# Patient Record
Sex: Female | Born: 1955 | Race: White | Hispanic: No | Marital: Married | Smoking: Never smoker
Health system: Southern US, Community
[De-identification: ages and names within clinical notes are randomized; demographics above are authoritative.]

## PROBLEM LIST (undated history)

## (undated) DIAGNOSIS — R42 Dizziness and giddiness: Secondary | ICD-10-CM

## (undated) DIAGNOSIS — C801 Malignant (primary) neoplasm, unspecified: Secondary | ICD-10-CM

## (undated) DIAGNOSIS — E7800 Pure hypercholesterolemia, unspecified: Secondary | ICD-10-CM

## (undated) DIAGNOSIS — Z9989 Dependence on other enabling machines and devices: Secondary | ICD-10-CM

## (undated) DIAGNOSIS — G4733 Obstructive sleep apnea (adult) (pediatric): Secondary | ICD-10-CM

## (undated) DIAGNOSIS — H8120 Vestibular neuronitis, unspecified ear: Secondary | ICD-10-CM

## (undated) DIAGNOSIS — I10 Essential (primary) hypertension: Secondary | ICD-10-CM

## (undated) DIAGNOSIS — M1990 Unspecified osteoarthritis, unspecified site: Secondary | ICD-10-CM

## (undated) DIAGNOSIS — K219 Gastro-esophageal reflux disease without esophagitis: Secondary | ICD-10-CM

## (undated) DIAGNOSIS — E039 Hypothyroidism, unspecified: Secondary | ICD-10-CM

## (undated) DIAGNOSIS — I499 Cardiac arrhythmia, unspecified: Secondary | ICD-10-CM

## (undated) DIAGNOSIS — Z8619 Personal history of other infectious and parasitic diseases: Secondary | ICD-10-CM

## (undated) DIAGNOSIS — E669 Obesity, unspecified: Secondary | ICD-10-CM

## (undated) HISTORY — DX: Vestibular neuronitis, unspecified ear: H81.20

## (undated) HISTORY — DX: Obstructive sleep apnea (adult) (pediatric): G47.33

## (undated) HISTORY — DX: Dizziness and giddiness: R42

## (undated) HISTORY — DX: Obesity, unspecified: E66.9

## (undated) HISTORY — DX: Dependence on other enabling machines and devices: Z99.89

---
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
## 1983-05-10 HISTORY — PX: TUBAL LIGATION: SHX77

## 1999-09-29 ENCOUNTER — Encounter: Payer: Self-pay | Admitting: Family Medicine

## 1999-09-29 ENCOUNTER — Encounter: Admission: RE | Admit: 1999-09-29 | Discharge: 1999-09-29 | Payer: Self-pay | Admitting: Family Medicine

## 2000-01-06 ENCOUNTER — Ambulatory Visit (HOSPITAL_COMMUNITY): Admission: RE | Admit: 2000-01-06 | Discharge: 2000-01-06 | Payer: Self-pay | Admitting: *Deleted

## 2000-01-06 ENCOUNTER — Encounter: Payer: Self-pay | Admitting: *Deleted

## 2000-10-26 ENCOUNTER — Encounter: Payer: Self-pay | Admitting: Family Medicine

## 2000-10-26 ENCOUNTER — Encounter: Admission: RE | Admit: 2000-10-26 | Discharge: 2000-10-26 | Payer: Self-pay | Admitting: Family Medicine

## 2001-01-05 ENCOUNTER — Other Ambulatory Visit: Admission: RE | Admit: 2001-01-05 | Discharge: 2001-01-05 | Payer: Self-pay | Admitting: Family Medicine

## 2001-01-15 ENCOUNTER — Encounter: Payer: Self-pay | Admitting: Family Medicine

## 2001-01-15 ENCOUNTER — Ambulatory Visit (HOSPITAL_COMMUNITY): Admission: RE | Admit: 2001-01-15 | Discharge: 2001-01-15 | Payer: Self-pay | Admitting: Family Medicine

## 2001-10-29 ENCOUNTER — Encounter: Admission: RE | Admit: 2001-10-29 | Discharge: 2001-10-29 | Payer: Self-pay | Admitting: Family Medicine

## 2001-10-29 ENCOUNTER — Encounter: Payer: Self-pay | Admitting: Family Medicine

## 2002-10-31 ENCOUNTER — Encounter: Payer: Self-pay | Admitting: Family Medicine

## 2002-10-31 ENCOUNTER — Encounter: Admission: RE | Admit: 2002-10-31 | Discharge: 2002-10-31 | Payer: Self-pay | Admitting: Family Medicine

## 2003-10-29 ENCOUNTER — Other Ambulatory Visit: Admission: RE | Admit: 2003-10-29 | Discharge: 2003-10-29 | Payer: Self-pay | Admitting: Family Medicine

## 2003-10-30 ENCOUNTER — Encounter: Admission: RE | Admit: 2003-10-30 | Discharge: 2003-10-30 | Payer: Self-pay | Admitting: Family Medicine

## 2004-11-01 ENCOUNTER — Other Ambulatory Visit: Admission: RE | Admit: 2004-11-01 | Discharge: 2004-11-01 | Payer: Self-pay | Admitting: Family Medicine

## 2004-11-08 ENCOUNTER — Encounter: Admission: RE | Admit: 2004-11-08 | Discharge: 2004-11-08 | Payer: Self-pay | Admitting: Family Medicine

## 2005-11-02 ENCOUNTER — Encounter: Admission: RE | Admit: 2005-11-02 | Discharge: 2005-11-02 | Payer: Self-pay | Admitting: Family Medicine

## 2005-11-04 ENCOUNTER — Other Ambulatory Visit: Admission: RE | Admit: 2005-11-04 | Discharge: 2005-11-04 | Payer: Self-pay | Admitting: Family Medicine

## 2006-11-01 ENCOUNTER — Encounter: Admission: RE | Admit: 2006-11-01 | Discharge: 2006-11-01 | Payer: Self-pay | Admitting: Family Medicine

## 2006-11-29 ENCOUNTER — Ambulatory Visit: Payer: Self-pay

## 2006-12-04 ENCOUNTER — Encounter: Admission: RE | Admit: 2006-12-04 | Discharge: 2006-12-04 | Payer: Self-pay | Admitting: Family Medicine

## 2006-12-20 ENCOUNTER — Ambulatory Visit: Payer: Self-pay | Admitting: Otolaryngology

## 2007-11-01 ENCOUNTER — Encounter: Admission: RE | Admit: 2007-11-01 | Discharge: 2007-11-01 | Payer: Self-pay | Admitting: Family Medicine

## 2007-11-19 IMAGING — CR DG FEMUR 2V*L*
4 series · 4 of 4 positions shown · non-contrast
Comparison: None.

CLINICAL DATA: Anterior and mid femur pain.
 LEFT FEMUR, TWO VIEWS:

[t femur with hip  ap left]
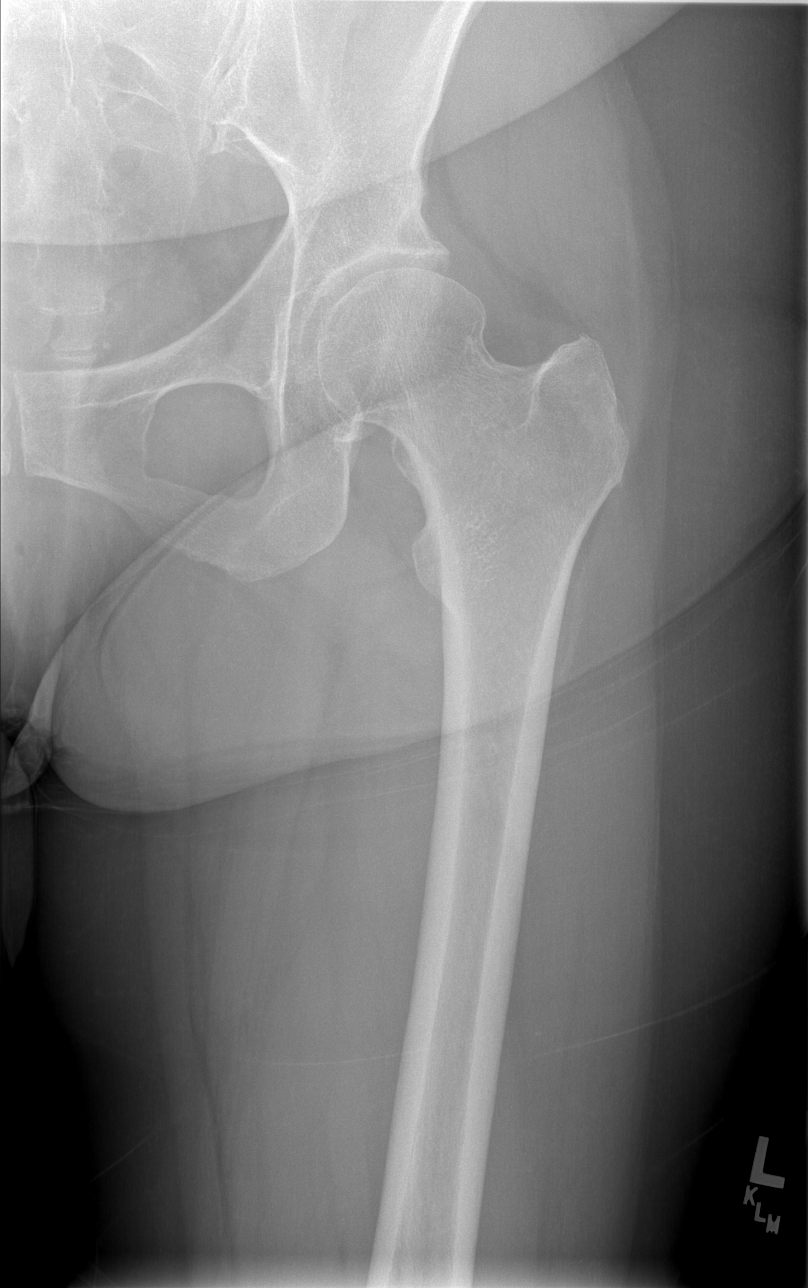

[t femur with knee ap left]
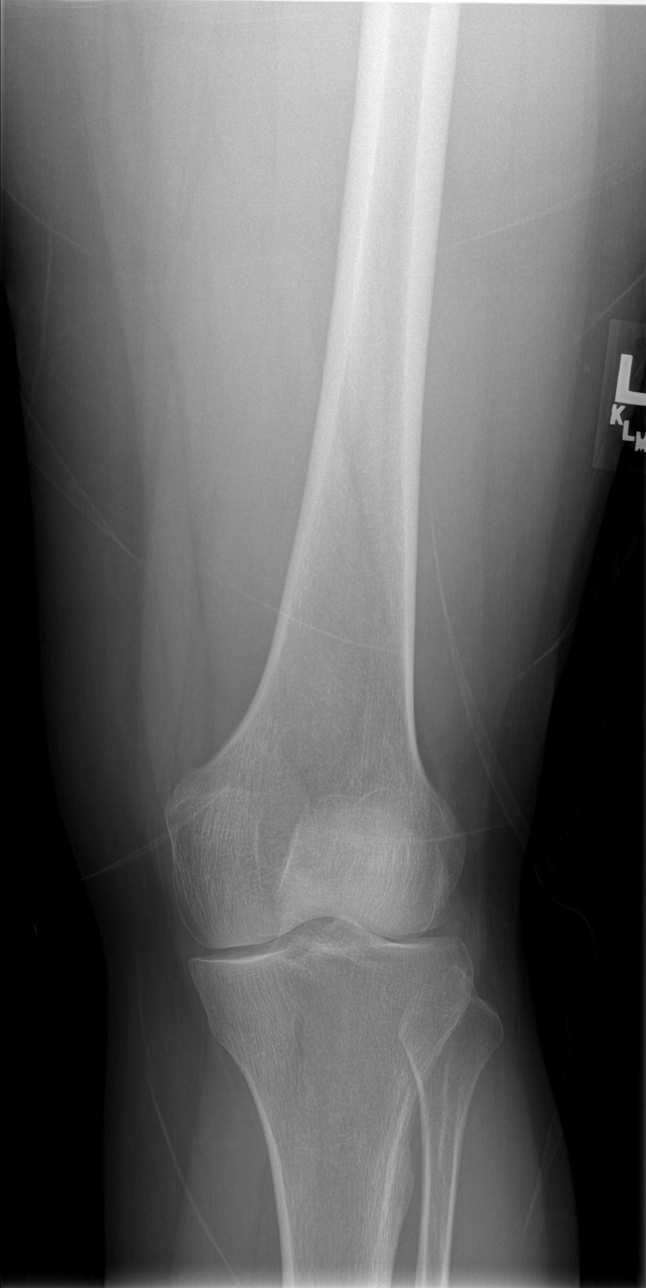

[t femur with hip lat left]
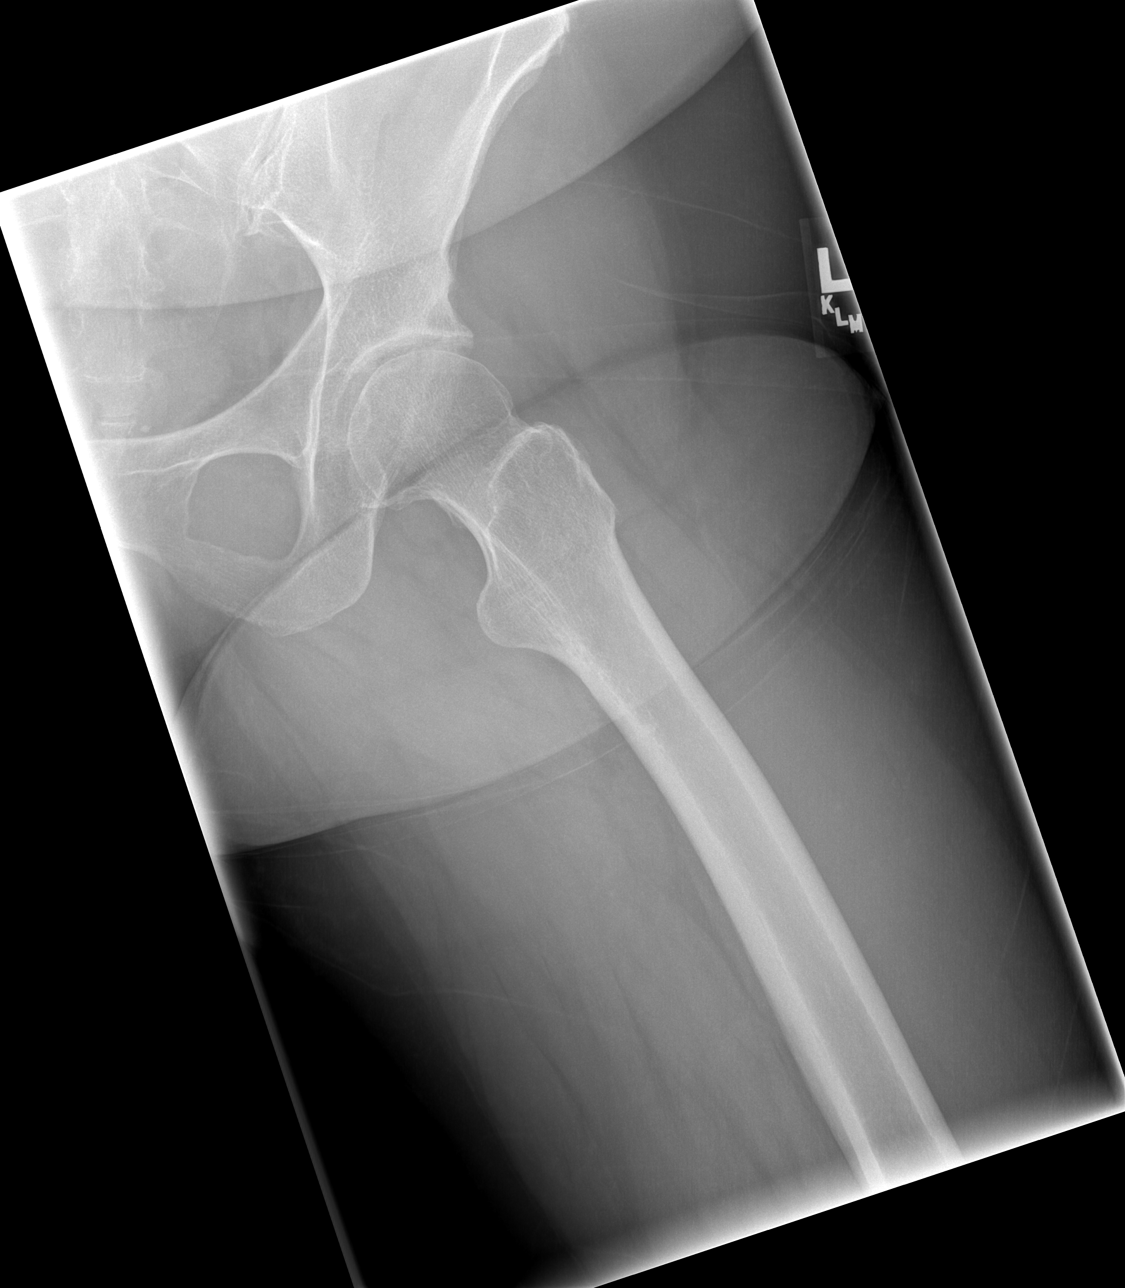

[t femur with knee lat left]
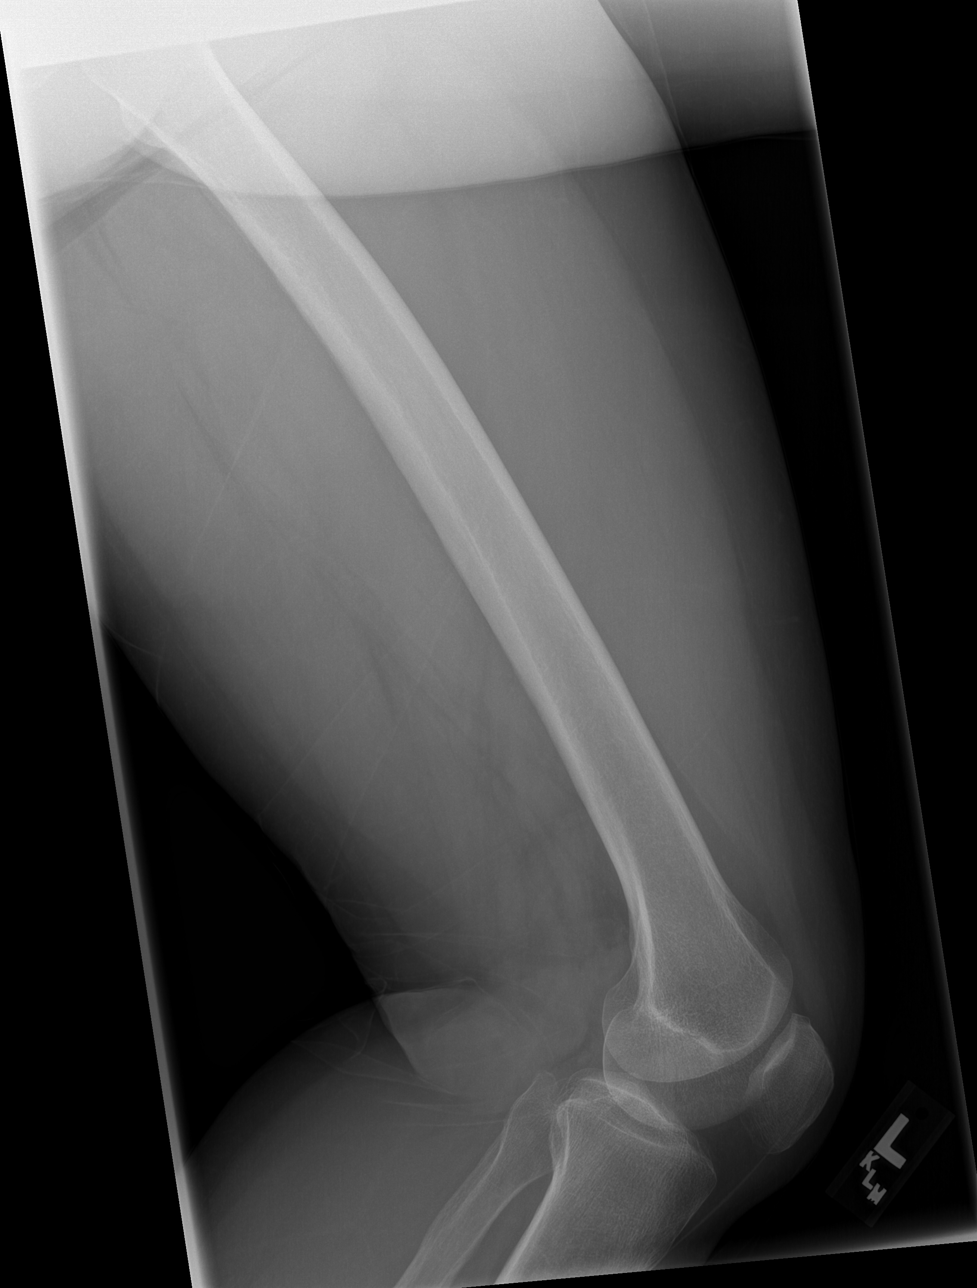

[4 of 4 positions shown; findings below may reference images not displayed]

FINDINGS: No acute osseous abnormality.
IMPRESSION: No acute osseous abnormality.

## 2008-08-27 ENCOUNTER — Encounter: Admission: RE | Admit: 2008-08-27 | Discharge: 2008-08-27 | Payer: Self-pay | Admitting: Family Medicine

## 2008-08-27 ENCOUNTER — Other Ambulatory Visit: Admission: RE | Admit: 2008-08-27 | Discharge: 2008-08-27 | Payer: Self-pay | Admitting: Family Medicine

## 2008-10-16 ENCOUNTER — Encounter: Admission: RE | Admit: 2008-10-16 | Discharge: 2008-10-16 | Payer: Self-pay | Admitting: Orthopedic Surgery

## 2008-10-30 ENCOUNTER — Encounter: Admission: RE | Admit: 2008-10-30 | Discharge: 2008-10-30 | Payer: Self-pay | Admitting: Family Medicine

## 2009-08-19 ENCOUNTER — Encounter: Admission: RE | Admit: 2009-08-19 | Discharge: 2009-08-19 | Payer: Self-pay | Admitting: Orthopedic Surgery

## 2009-10-29 ENCOUNTER — Encounter: Admission: RE | Admit: 2009-10-29 | Discharge: 2009-10-29 | Payer: Self-pay | Admitting: Family Medicine

## 2010-01-13 LAB — HM COLONOSCOPY

## 2010-02-03 ENCOUNTER — Encounter: Admission: RE | Admit: 2010-02-03 | Discharge: 2010-02-03 | Payer: Self-pay | Admitting: Orthopedic Surgery

## 2010-05-26 ENCOUNTER — Encounter
Admission: RE | Admit: 2010-05-26 | Discharge: 2010-05-26 | Payer: Self-pay | Source: Home / Self Care | Attending: Orthopedic Surgery | Admitting: Orthopedic Surgery

## 2010-09-29 ENCOUNTER — Other Ambulatory Visit: Payer: Self-pay | Admitting: Family Medicine

## 2010-09-29 ENCOUNTER — Other Ambulatory Visit (HOSPITAL_COMMUNITY)
Admission: RE | Admit: 2010-09-29 | Discharge: 2010-09-29 | Disposition: A | Discharge status: Home / Self Care | Payer: BC Managed Care – PPO | Source: Ambulatory Visit | Attending: Family Medicine | Admitting: Family Medicine

## 2010-09-29 DIAGNOSIS — Z124 Encounter for screening for malignant neoplasm of cervix: Secondary | ICD-10-CM | POA: Insufficient documentation

## 2010-10-12 ENCOUNTER — Other Ambulatory Visit: Payer: Self-pay | Admitting: Family Medicine

## 2010-10-12 DIAGNOSIS — Z1231 Encounter for screening mammogram for malignant neoplasm of breast: Secondary | ICD-10-CM

## 2010-10-21 ENCOUNTER — Other Ambulatory Visit: Payer: Self-pay | Admitting: Family Medicine

## 2010-10-21 ENCOUNTER — Ambulatory Visit
Admission: RE | Admit: 2010-10-21 | Discharge: 2010-10-21 | Disposition: A | Discharge status: Home / Self Care | Payer: BC Managed Care – PPO | Source: Ambulatory Visit | Attending: Family Medicine | Admitting: Family Medicine

## 2010-10-21 DIAGNOSIS — Z1231 Encounter for screening mammogram for malignant neoplasm of breast: Secondary | ICD-10-CM

## 2011-06-08 ENCOUNTER — Other Ambulatory Visit: Payer: Self-pay | Admitting: Obstetrics and Gynecology

## 2011-08-18 ENCOUNTER — Encounter (HOSPITAL_COMMUNITY): Payer: Self-pay

## 2011-08-24 ENCOUNTER — Other Ambulatory Visit: Payer: Self-pay | Admitting: Obstetrics and Gynecology

## 2011-08-31 ENCOUNTER — Other Ambulatory Visit: Payer: Self-pay

## 2011-08-31 ENCOUNTER — Encounter (HOSPITAL_COMMUNITY)
Admission: RE | Admit: 2011-08-31 | Discharge: 2011-08-31 | Disposition: A | Discharge status: Home / Self Care | Payer: BC Managed Care – PPO | Source: Ambulatory Visit | Attending: Obstetrics and Gynecology | Admitting: Obstetrics and Gynecology

## 2011-08-31 ENCOUNTER — Encounter (HOSPITAL_COMMUNITY): Payer: Self-pay

## 2011-08-31 DIAGNOSIS — Z01812 Encounter for preprocedural laboratory examination: Secondary | ICD-10-CM | POA: Insufficient documentation

## 2011-08-31 DIAGNOSIS — Z01818 Encounter for other preprocedural examination: Secondary | ICD-10-CM | POA: Insufficient documentation

## 2011-08-31 HISTORY — DX: Essential (primary) hypertension: I10

## 2011-08-31 LAB — DIFFERENTIAL
Basophils Absolute: 0 K/uL (ref 0.0–0.1)
Basophils Relative: 1 % (ref 0–1)
Eosinophils Absolute: 0.2 K/uL (ref 0.0–0.7)
Eosinophils Relative: 2 % (ref 0–5)
Lymphocytes Relative: 27 % (ref 12–46)
Lymphs Abs: 1.8 K/uL (ref 0.7–4.0)
Monocytes Absolute: 0.5 K/uL (ref 0.1–1.0)
Monocytes Relative: 7 % (ref 3–12)
Neutro Abs: 4.2 K/uL (ref 1.7–7.7)
Neutrophils Relative %: 63 % (ref 43–77)

## 2011-08-31 LAB — BASIC METABOLIC PANEL
BUN: 13 mg/dL (ref 6–23)
CO2: 30 mEq/L (ref 19–32)
Creatinine, Ser: 0.75 mg/dL (ref 0.50–1.10)
GFR calc non Af Amer: >90 mL/min (ref >90)
Sodium: 140 mEq/L (ref 135–145)

## 2011-08-31 LAB — CBC
Hemoglobin: 13.5 g/dL (ref 12.0–15.0)
MCH: 30.2 pg (ref 26.0–34.0)
MCHC: 33.8 g/dL (ref 30.0–36.0)
MCV: 89.5 fL (ref 78.0–100.0)
RBC: 4.47 MIL/uL (ref 3.87–5.11)
RDW: 12.9 % (ref 11.5–15.5)

## 2011-08-31 NOTE — Patient Instructions (Signed)
20 Mary Munoz  08/31/2011   Your procedure is scheduled on:  5/15  Enter through the Main Entrance of [facility] at 930 AM.  Pick up the phone at the desk and dial [phone].   Call this number if you have problems the morning of surgery: [phone]   Remember:   Do not eat food:After Midnight.  Do not drink clear liquids: After Midnight.  Take these medicines the morning of surgery with A SIP OF WATER: Blood Pressure medication   Do not wear jewelry, make-up or nail polish.  Do not wear lotions, powders, or perfumes. You may wear deodorant.  Do not shave 48 hours prior to surgery.  Do not bring valuables to the hospital.  Contacts, dentures or bridgework may not be worn into surgery.  Leave suitcase in the car. After surgery it may be brought to your room.  For patients admitted to the hospital, checkout time is 11:00 AM the day of discharge.   Patients discharged the day of surgery will not be allowed to drive home.  Name and phone number of your driver: husband  Lowry Ram  Special Instructions: CHG Shower Use Special Wash: 1/2 bottle night before surgery and 1/2 bottle morning of surgery.   Please read over the following fact sheets that you were given: Surgical Site Infection Prevention

## 2011-08-31 NOTE — Pre-Procedure Instructions (Signed)
Anes pre-op by Dr. Malen Gauze, EKG and medical history reviewed.  Patton Salles, RN

## 2011-09-05 ENCOUNTER — Ambulatory Visit: Admit: 2011-09-05 | Payer: Self-pay | Admitting: Obstetrics and Gynecology

## 2011-09-05 SURGERY — DILATATION & CURETTAGE/HYSTEROSCOPY WITH RESECTOCOPE
Anesthesia: Choice

## 2011-09-14 ENCOUNTER — Other Ambulatory Visit: Payer: Self-pay | Admitting: Obstetrics and Gynecology

## 2011-09-14 NOTE — H&P (Signed)
09/01/2011  Reason for Appointment  1. Pre-op   History of Present Illness  General:  56 y/o G1P1 present for preop for Hysteroscopy/D&C on 09/21/2011. Pt has a h/o postmenopausal bleeding, which may have been due to unopposed estrogen she received during a natural HRT program. On 3/13, an ultrasound showed a thickened endometrium, 1.36 cm and an endometrial mass, suggesitive of a polyp, despite Provera. Surgical intervention recommend. Pt has not had any more bleeding since Provera.   Current Medications  Tramadol 50 mg tablet one tab every 4-6 hours prn pain  Diclofenac Sodium 75 MG Tablet Delayed Release 1 tablet twice a day if needed for pain  Glucosamine 500 MG Tablet 1 tablet once a day  Vitamin B6 250 MG Tablet 1 tablet Once a day  Zinc Acetate (Oral) 25 MG Capsule 1 capsule on an empty stomach once a day  Vitamin D 5000 IU Capsule 1 tablet once a day  Magnesium 500 MG Capsule 1 tablet once a day  Hydrochlorothiazide 25 MG Tablet 1 tablet Once a day  Medication List reviewed and reconciled with the patient   Past Medical History  goiter DX:240.9  Hyperlipidemia  Htn  Obesity  Postmenopausal  left hip problems - Ortho: Dr Anna Voytek  Obstructive sleep apnea (PSG 10/21/10, ESS 14, AHI 38/hr REM 54/hr, RDI 52/hr REM 59/hr, O2 nadir 70%; CPAP 11/13/10 CPAP 8 with AHI 0)   Surgical History  BTL   breast biopsy    Family History  Father: deceased 86 yrs MI - 86, HTN   Mother: deceased 87 yrs dementia, HTN, diabetes, thyroid disease   Sister 1: alive 68 yrs colon cancer/ age 68, thyroid problems   Sister 2: alive 62 yrs thyroid problems   no early CAD , denies any GYN family cancer hx.   Social History  General:  History of smoking  cigarettes: Never smoked no Smoking.  no Alcohol.  Caffeine: yes, 1 serving daily, coffee.  no Recreational drug use.  Diet: low fat.  no Exercise, nothing structured - limited due to her hip.  Occupation: Clerk/Accounting.  Education:  Some College.  Marital Status: married.  Children: 1, son (s) - lives close by.  Seat belt use: yes.    Gyn History  Sexual activity currently sexually active, men only.  Periods : postmenopausal.  LMP started bleeding 06/04/11 and it was like a normal period. Had cramping as well. Nothing Sunday and Monday but spotting yesterday and today..  Birth control BTL.  Last pap smear date 09/29/10.  Last mammogram date planned June 2012.  Abnormal pap smear yes but last 3 have been normal. .  Denies H/O STD .  GYN procedures Pelvic U/S 07/20/11.    OB History  Number of pregnancies 1 - vaginal delivery.  miscarriages 0.  abortion 0.  Pregnancy # 1 live birth, vaginal delivery.    Allergies  Sulfa drugs (for allergy): sores in mouth: Allergy  citric acid: bladder infection: Side Effects   Hospitalization/Major Diagnostic Procedure  childbirth   breast biopsy surgery    Vital Signs  Wt 162, Wt change .3 lb, Ht 62, BMI 29.63, Pulse sitting 77, BP sitting 145/85.   Physical Examination  GENERAL:  Patient appears alert and oriented.  General Appearance: well-appearing, well-developed, no acute distress.  Speech: clear.  NECK:  Thyroid: no thyromegaly.  HEART:  Heart sounds: normal, RRR, no murmur.  ABDOMEN:  General: no masses tenderness or organomegaly, soft, non distended.  FEMALE GENITOURINARY:  General   deferred.  EXTREMITIES:  General: no clubbing, no edema, no calf tenderness.     Assessments   1. Preop examination - V72.84 (Primary)   2. Thickened endometrium - 793.5   3. Endometrial mass - 625.8, Likely polyp   4. Postmenopausal bleeding - 627.1   Treatment  1. Thickened endometrium  Hyst/D&C/polypectomy to rule out endometrial cancer.. R/B/A of procedure discussed with pt. All questions answered.    Follow Up  2 Weeks Post op    

## 2011-09-21 ENCOUNTER — Encounter (HOSPITAL_COMMUNITY)
Admission: RE | Disposition: A | Discharge status: Home / Self Care | Payer: Self-pay | Source: Ambulatory Visit | Attending: Obstetrics and Gynecology

## 2011-09-21 ENCOUNTER — Ambulatory Visit (HOSPITAL_COMMUNITY): Payer: BC Managed Care – PPO | Admitting: Anesthesiology

## 2011-09-21 ENCOUNTER — Encounter (HOSPITAL_COMMUNITY): Payer: Self-pay | Admitting: Anesthesiology

## 2011-09-21 ENCOUNTER — Ambulatory Visit (HOSPITAL_COMMUNITY)
Admission: RE | Admit: 2011-09-21 | Discharge: 2011-09-21 | Disposition: A | Discharge status: Home / Self Care | Payer: BC Managed Care – PPO | Source: Ambulatory Visit | Attending: Obstetrics and Gynecology | Admitting: Obstetrics and Gynecology

## 2011-09-21 DIAGNOSIS — I10 Essential (primary) hypertension: Secondary | ICD-10-CM | POA: Insufficient documentation

## 2011-09-21 DIAGNOSIS — N840 Polyp of corpus uteri: Secondary | ICD-10-CM | POA: Insufficient documentation

## 2011-09-21 DIAGNOSIS — N950 Postmenopausal bleeding: Secondary | ICD-10-CM | POA: Insufficient documentation

## 2011-09-21 DIAGNOSIS — Z01818 Encounter for other preprocedural examination: Secondary | ICD-10-CM | POA: Insufficient documentation

## 2011-09-21 DIAGNOSIS — R9389 Abnormal findings on diagnostic imaging of other specified body structures: Secondary | ICD-10-CM | POA: Insufficient documentation

## 2011-09-21 DIAGNOSIS — Z01812 Encounter for preprocedural laboratory examination: Secondary | ICD-10-CM | POA: Insufficient documentation

## 2011-09-21 HISTORY — PX: DILATION AND CURETTAGE OF UTERUS: SHX78

## 2011-09-21 SURGERY — DILATATION & CURETTAGE/HYSTEROSCOPY WITH RESECTOCOPE
Anesthesia: General | Site: Uterus | Wound class: Clean Contaminated

## 2011-09-21 MED ORDER — LIDOCAINE HCL 2 % IJ SOLN
INTRAMUSCULAR | Status: AC
  Filled 2011-09-21: qty 1

## 2011-09-21 MED ORDER — GLYCINE 1.5 % IR SOLN: Status: DC | PRN

## 2011-09-21 MED ORDER — MIDAZOLAM HCL 2 MG/2ML IJ SOLN: 0.5000 mg | Freq: Once | INTRAMUSCULAR | Status: DC | PRN

## 2011-09-21 MED ORDER — CEFAZOLIN SODIUM 1-5 GM-% IV SOLN
INTRAVENOUS | Status: AC
  Filled 2011-09-21: qty 50

## 2011-09-21 MED ORDER — PROMETHAZINE HCL 25 MG/ML IJ SOLN: 6.2500 mg | INTRAMUSCULAR | Status: DC | PRN

## 2011-09-21 MED ORDER — LACTATED RINGERS IV SOLN
INTRAVENOUS | Status: DC
  Administered 2011-09-21: 11:00:00 via INTRAVENOUS

## 2011-09-21 MED ORDER — IBUPROFEN 600 MG PO TABS: 600.0000 mg | ORAL_TABLET | Freq: Four times a day (QID) | ORAL | Status: AC | PRN

## 2011-09-21 MED ORDER — FENTANYL CITRATE 0.05 MG/ML IJ SOLN
INTRAMUSCULAR | Status: DC | PRN
  Administered 2011-09-21: 50 ug via INTRAVENOUS

## 2011-09-21 MED ORDER — ACETAMINOPHEN 325 MG PO TABS: 325.0000 mg | ORAL_TABLET | ORAL | Status: DC | PRN

## 2011-09-21 MED ORDER — FENTANYL CITRATE 0.05 MG/ML IJ SOLN
INTRAMUSCULAR | Status: AC
  Filled 2011-09-21: qty 5

## 2011-09-21 MED ORDER — LIDOCAINE HCL 2 % IJ SOLN
INTRAMUSCULAR | Status: DC | PRN
  Administered 2011-09-21: 6 mL

## 2011-09-21 MED ORDER — KETOROLAC TROMETHAMINE 30 MG/ML IJ SOLN: 15.0000 mg | Freq: Once | INTRAMUSCULAR | Status: DC | PRN

## 2011-09-21 MED ORDER — MIDAZOLAM HCL 2 MG/2ML IJ SOLN
INTRAMUSCULAR | Status: AC
  Filled 2011-09-21: qty 2

## 2011-09-21 MED ORDER — CEFAZOLIN SODIUM 1-5 GM-% IV SOLN
1.0000 g | INTRAVENOUS | Status: AC
  Administered 2011-09-21: 1 g via INTRAVENOUS

## 2011-09-21 MED ORDER — DEXAMETHASONE SODIUM PHOSPHATE 4 MG/ML IJ SOLN
INTRAMUSCULAR | Status: DC | PRN
  Administered 2011-09-21: 10 mg via INTRAVENOUS

## 2011-09-21 MED ORDER — LIDOCAINE HCL (CARDIAC) 20 MG/ML IV SOLN
INTRAVENOUS | Status: DC | PRN
  Administered 2011-09-21: 20 mg via INTRAVENOUS

## 2011-09-21 MED ORDER — MIDAZOLAM HCL 5 MG/5ML IJ SOLN
INTRAMUSCULAR | Status: DC | PRN
  Administered 2011-09-21: 2 mg via INTRAVENOUS

## 2011-09-21 MED ORDER — FENTANYL CITRATE 0.05 MG/ML IJ SOLN: 25.0000 ug | INTRAMUSCULAR | Status: DC | PRN

## 2011-09-21 MED ORDER — PROPOFOL 10 MG/ML IV EMUL
INTRAVENOUS | Status: DC | PRN
  Administered 2011-09-21 (×3): 5 mg via INTRAVENOUS

## 2011-09-21 MED ORDER — BUPIVACAINE HCL 0.75 % IJ SOLN
INTRAMUSCULAR | Status: DC | PRN
  Administered 2011-09-21: 10 mg via INTRATHECAL

## 2011-09-21 MED ORDER — FENTANYL CITRATE 0.05 MG/ML IJ SOLN
INTRAMUSCULAR | Status: AC
  Filled 2011-09-21: qty 2

## 2011-09-21 MED ORDER — ONDANSETRON HCL 4 MG/2ML IJ SOLN
INTRAMUSCULAR | Status: DC | PRN
  Administered 2011-09-21: 4 mg via INTRAVENOUS

## 2011-09-21 MED ORDER — SODIUM CHLORIDE 0.9 % IR SOLN
Status: DC | PRN
  Administered 2011-09-21: 1

## 2011-09-21 MED ORDER — MEPERIDINE HCL 25 MG/ML IJ SOLN: 6.2500 mg | INTRAMUSCULAR | Status: DC | PRN

## 2011-09-21 MED ORDER — SILVER NITRATE-POT NITRATE 75-25 % EX MISC
CUTANEOUS | Status: AC
  Filled 2011-09-21: qty 1

## 2011-09-21 SURGICAL SUPPLY — 15 items
CANISTER SUCTION 2500CC (MISCELLANEOUS) ×2 IMPLANT
CATH ROBINSON RED A/P 16FR (CATHETERS) ×2 IMPLANT
CLOTH BEACON ORANGE TIMEOUT ST (SAFETY) ×2 IMPLANT
CONTAINER PREFILL 10% NBF 60ML (FORM) ×4 IMPLANT
DRAPE HYSTEROSCOPY (DRAPE) ×1 IMPLANT
GLOVE BIO SURGEON STRL SZ 6.5 (GLOVE) ×2 IMPLANT
GLOVE BIOGEL PI IND STRL 7.0 (GLOVE) ×2 IMPLANT
GLOVE BIOGEL PI INDICATOR 7.0 (GLOVE) ×2
GLOVE ECLIPSE 7.0 STRL STRAW (GLOVE) ×1 IMPLANT
GOWN PREVENTION PLUS LG XLONG (DISPOSABLE) ×4 IMPLANT
GOWN STRL REIN XL XLG (GOWN DISPOSABLE) ×2 IMPLANT
LOOP ANGLED CUTTING 22FR (CUTTING LOOP) IMPLANT
PACK HYSTEROSCOPY LF (CUSTOM PROCEDURE TRAY) ×2 IMPLANT
TOWEL OR 17X24 6PK STRL BLUE (TOWEL DISPOSABLE) ×4 IMPLANT
WATER STERILE IRR 1000ML POUR (IV SOLUTION) ×2 IMPLANT

## 2011-09-21 NOTE — Brief Op Note (Signed)
09/21/2011  11:57 AM  PATIENT:  Mary Munoz  57 y.o. female  PRE-OPERATIVE DIAGNOSIS:  Post Menopausal Bleeding, Endometrial mass  POST-OPERATIVE DIAGNOSIS:  Post Menopausal Bleeding, Endometrial polyp  PROCEDURE:  Procedure(s) (LRB): Hysteroscopy and D&C, Removal of endometrial polyp with TruClear  SURGEON:  Surgeon(s) and Role:    * Geryl Rankins, MD - Primary  PHYSICIAN ASSISTANT: None  ASSISTANTS: None   ANESTHESIA:   local and epidural  EBL:  Total I/O In: 1000 [I.V.:1000] Out: 100 [Urine:100] Hysteroscopy deficit 170 ml  BLOOD ADMINISTERED:none  DRAINS: I/O cath prior to procedure   LOCAL MEDICATIONS USED: 2 %  LIDOCAINE   SPECIMEN:  Source of Specimen:  Endometrial polyps and endometrial currettings  DISPOSITION OF SPECIMEN:  PATHOLOGY  COUNTS:  YES  TOURNIQUET:  * No tourniquets in log *  DICTATION: .Other Dictation: Dictation Number [id number]  PLAN OF CARE: Discharge to home after PACU  PATIENT DISPOSITION:  PACU - hemodynamically stable.   Delay start of Pharmacological VTE agent (>24hrs) due to surgical blood loss or risk of bleeding: not applicable

## 2011-09-21 NOTE — Discharge Instructions (Addendum)
Dilation and Curettage or Vacuum Curettage Care After Refer to this sheet in the next few weeks. These instructions provide you with general information on caring for yourself after your procedure. Your caregiver may also give you more specific instructions. Your treatment has been planned according to current medical practices, but problems sometimes occur. Call your caregiver if you have any problems or questions after your procedure. HOME CARE INSTRUCTIONS   Do not drive for 24 hours.   Wait 1 week before returning to strenuous activities.   Take your temperature 2 times a day for 4 days and write it down. Provide these temperatures to your caregiver if you develop a fever.   Avoid long periods of standing, and do no heavy lifting (more than 10 pounds or 4.5 kg), pushing, or pulling.   Limit stair climbing to once or twice a day.   Take rest periods often.   You may resume your usual diet.   Drink enough fluids to keep your urine clear or pale yellow.   You should return to your usual bowel function. If constipation should occur, you may:   Take a mild laxative with permission from your caregiver.   Add fruit and bran to your diet.   Drink more fluids.   Take showers instead of baths until your caregiver gives you permission to take baths.   Do not go swimming or use a hot tub until your caregiver gives you permission.   Try to have someone with you or available to you the first 24 to 48 hours, especially if you had a general anesthetic.   Do not douche, use tampons, or have intercourse until after your follow-up appointment, or when your caregiver approves.   Only take over-the-counter or prescription medicines for pain, discomfort, or fever as directed by your caregiver. Do not take aspirin. It can cause bleeding.   If a prescription was given, follow your caregiver's directions.   Keep all your follow-up appointments recommended by your caregiver.  SEEK MEDICAL CARE  IF:   You have increasing cramps or pain not relieved with medicine.   You have abdominal pain which does not seem to be related to the same area of earlier cramping and pain.   You have bad smelling vaginal discharge.   You have a rash.   You have problems with any medicine.  SEEK IMMEDIATE MEDICAL CARE IF:   You have bleeding that is heavier than a normal menstrual period.   You have a fever.   You have chest pain.   You have shortness of breath.   You feel dizzy or feel like fainting.   You pass out.   You have pain in your shoulder strap area.   You have heavy vaginal bleeding with or without blood clots.  MAKE SURE YOU:   Understand these instructions.   Will watch your condition.   Will get help right away if you are not doing well or get worse.  Document Released: 04/22/2000 Document Revised: 04/14/2011 Document Reviewed: 11/20/2008 Exit[employer] Patient Information 2012 [employer].DISCHARGE INSTRUCTIONS: D&C / D&E The following instructions have been prepared to help you care for yourself upon your return home.   Personal hygiene: . Use sanitary pads for vaginal drainage, not tampons. . Shower the day after your procedure. . NO tub baths, pools or Jacuzzis for 2-3 weeks. . Wipe front to back after using the bathroom.  Activity and limitations: . Do NOT drive or operate any equipment for 24 hours. The   effects of anesthesia are still present and drowsiness may result. . Do NOT rest in bed all day. . Walking is encouraged. . Walk up and down stairs slowly. . You may resume your normal activity in one to two days or as indicated by your physician.  Sexual activity: NO intercourse for at least 2 weeks after the procedure, or as indicated by your physician.  Diet: Eat a light meal as desired this evening. You may resume your usual diet tomorrow.  Return to work: You may resume your work activities in one to two days or as indicated by your doctor.  What to  expect after your surgery: Expect to have vaginal bleeding/discharge for 2-3 days and spotting for up to 10 days. It is not unusual to have soreness for up to 1-2 weeks. You may have a slight burning sensation when you urinate for the first day. Mild cramps may continue for a couple of days. You may have a regular period in 2-6 weeks.  Call your doctor for any of the following: . Excessive vaginal bleeding, saturating and changing one pad every hour. . Inability to urinate 6 hours after discharge from hospital. . Pain not relieved by pain medication. . Fever of 100.4 F or greater. . Unusual vaginal discharge or odor.  Return to office ________________ Call for an appointment ___________________  Patient's signature: ______________________  Nurse's signature ________________________  Post Anesthesia Care Unit [phone]   

## 2011-09-21 NOTE — Op Note (Signed)
NAME:  [name], Mary Munoz NO.:  [id number]  MEDICAL RECORD NO.:  [id number]  LOCATION:  [facility]                          FACILITY:  [facility]  PHYSICIAN:  Pieter Partridge, MD   DATE OF BIRTH:  22-Jun-1955  DATE OF PROCEDURE:  09/21/2011 DATE OF DISCHARGE:  09/21/2011                              OPERATIVE REPORT   PREOPERATIVE DIAGNOSES:  Postmenopausal bleeding, endometrial mass.  POSTOPERATIVE DIAGNOSES:  Postmenopausal bleeding, endometrial polyp.  PROCEDURES:  Hysteroscopy, D and C with Truclear.  SURGEON:  Pieter Partridge, MD.  PHYSICIAN ASSISTANT:  None.  ASSISTANT:  None.  ANESTHESIA:  Local and epidural.  EBL is 1000 in, urine is 100 out prior to procedure.  Hysteroscopy deficit is 170 mL of normal saline.  BLOOD ADMINISTERED:  None.  Lidocaine 2%, 6 mL.  SOURCE OF SPECIMEN:  Endometrial polyps and endometrial curettings to Pathology.  The patient to PACU, hemodynamically stable.  FINDINGS:  A large polypoid mass, soft anteriorly.  After resection, the second polyp was noted.  No other suspicious endometrial findings.  PROCEDURE IN DETAIL:  [name] was taken to the operating room with IV running.  She underwent spinal anesthesia without complications.  She was then placed in the dorsal lithotomy position.  Prepped and draped in normal sterile fashion.  A Graves speculum was inserted into the vagina. A 2 mL of 2% lidocaine injected at the anterior lip of the cervix.  The anterior lip of the cervix was grasped with the tenaculum.  A paracervical block was performed.  The cervix was then easily dilated up to 6 Hegar.  The hysteroscope was then advanced to note the findings above.  Resection of device was then done with a small polyp blade of the Trueclear device and that was done under direct visualization.  No complications noted.  Complete resection of both polyps noted. Also some of the endometrial lining appeared polypoid, so that was removed as  well.  A sharp curettage of all 4 quadrants of the uterus was performed and after procedure, there was no perforation noted.  The uterus appeared to be normal.  Uterus was sounded after removal of the polyp and it was 7-8 cm.  The patient tolerated the procedure well.  All instrument, sponge, and needle counts were correct x3.  She was then taken to the recovery room in stable condition.     Pieter Partridge, MD     EBV/MEDQ  D:  09/21/2011  T:  09/21/2011  Job:  [id number]

## 2011-09-21 NOTE — Anesthesia Procedure Notes (Signed)
Spinal  Patient location during procedure: OR Start time: 09/21/2011 11:11 AM Staffing Anesthesiologist: Brayton Caves R Performed by: anesthesiologist  Preanesthetic Checklist Completed: patient identified, site marked, surgical consent, pre-op evaluation, timeout performed, IV checked, risks and benefits discussed and monitors and equipment checked Spinal Block Patient position: sitting Prep: DuraPrep Patient monitoring: heart rate, cardiac monitor, continuous pulse ox and blood pressure Approach: midline Location: L3-4 Injection technique: single-shot Needle Needle type: Sprotte  Needle gauge: 24 G Needle length: 9 cm Assessment Sensory level: T4 Additional Notes Patient identified.  Risk benefits discussed including failed block, incomplete pain control, headache, nerve damage, paralysis, blood pressure changes, nausea, vomiting, reactions to medication both toxic or allergic, and postpartum back pain.  Patient expressed understanding and wished to proceed.  All questions were answered.  Sterile technique used throughout procedure.  CSF was clear.  No parasthesia or other complications.  Please see nursing notes for vital signs.

## 2011-09-21 NOTE — Transfer of Care (Signed)
Immediate Anesthesia Transfer of Care Note  Patient: Mary Munoz  Procedure(s) Performed: Procedure(s) (LRB): DILATATION & CURETTAGE/HYSTEROSCOPY WITH RESECTOCOPE (N/A)  Patient Location: PACU  Anesthesia Type: Spinal  Level of Consciousness: awake, alert  and oriented  Airway & Oxygen Therapy: Patient Spontanous Breathing  Post-op Assessment: Report given to PACU RN and Post -op Vital signs reviewed and stable  Post vital signs: Reviewed and stable  Complications: No apparent anesthesia complications

## 2011-09-21 NOTE — Anesthesia Preprocedure Evaluation (Addendum)
Anesthesia Evaluation  Patient identified by MRN, date of birth, ID band Patient awake    Reviewed: Allergy & Precautions, H&P , Patient's Chart, lab work & pertinent test results, reviewed documented beta blocker date and time   History of Anesthesia Complications Negative for: history of anesthetic complications  Airway Mallampati: II TM Distance: >3 FB Neck ROM: full    Dental No notable dental hx.    Pulmonary neg pulmonary ROS, sleep apnea ,  breath sounds clear to auscultation  Pulmonary exam normal       Cardiovascular Exercise Tolerance: Good hypertension, negative cardio ROS  Rhythm:regular Rate:Normal     Neuro/Psych negative neurological ROS  negative psych ROS   GI/Hepatic negative GI ROS, Neg liver ROS,   Endo/Other  negative endocrine ROS  Renal/GU negative Renal ROS     Musculoskeletal   Abdominal   Peds  Hematology negative hematology ROS (+)   Anesthesia Other Findings Hip discomfort   left Sleep apnea      Hypertension    Long standing LBBB seen and cleared by Helmut Muster but EPIC charting down at that time. Stress test negative.   Reproductive/Obstetrics negative OB ROS                         Anesthesia Physical Anesthesia Plan  ASA: II  Anesthesia Plan: Spinal   Post-op Pain Management:    Induction:   Airway Management Planned:   Additional Equipment:   Intra-op Plan:   Post-operative Plan:   Informed Consent: I have reviewed the patients History and Physical, chart, labs and discussed the procedure including the risks, benefits and alternatives for the proposed anesthesia with the patient or authorized representative who has indicated his/her understanding and acceptance.   Dental Advisory Given  Plan Discussed with: CRNA, Surgeon and Anesthesiologist  Anesthesia Plan Comments:        Anesthesia Quick Evaluation

## 2011-09-21 NOTE — H&P (View-Only) (Signed)
09/01/2011  Reason for Appointment  1. Pre-op   History of Present Illness  General:  56 y/o G1P1 present for preop for Hysteroscopy/D&C on 09/21/2011. Pt has a h/o postmenopausal bleeding, which may have been due to unopposed estrogen she received during a natural HRT program. On 3/13, an ultrasound showed a thickened endometrium, 1.36 cm and an endometrial mass, suggesitive of a polyp, despite Provera. Surgical intervention recommend. Pt has not had any more bleeding since Provera.   Current Medications  Tramadol 50 mg tablet one tab every 4-6 hours prn pain  Diclofenac Sodium 75 MG Tablet Delayed Release 1 tablet twice a day if needed for pain  Glucosamine 500 MG Tablet 1 tablet once a day  Vitamin B6 250 MG Tablet 1 tablet Once a day  Zinc Acetate (Oral) 25 MG Capsule 1 capsule on an empty stomach once a day  Vitamin D 5000 IU Capsule 1 tablet once a day  Magnesium 500 MG Capsule 1 tablet once a day  Hydrochlorothiazide 25 MG Tablet 1 tablet Once a day  Medication List reviewed and reconciled with the patient   Past Medical History  goiter DX:240.9  Hyperlipidemia  Htn  Obesity  Postmenopausal  left hip problems - Ortho: Dr Lunette Stands  Obstructive sleep apnea (PSG 10/21/10, ESS 14, AHI 38/hr REM 54/hr, RDI 52/hr REM 59/hr, O2 nadir 70%; CPAP 11/13/10 CPAP 8 with AHI 0)   Surgical History  BTL   breast biopsy    Family History  Father: deceased 59 yrs MI - 53, HTN   Mother: deceased 47 yrs dementia, HTN, diabetes, thyroid disease   Sister 1: alive 67 yrs colon cancer/ age 38, thyroid problems   Sister 2: alive 88 yrs thyroid problems   no early CAD , denies any GYN family cancer hx.   Social History  General:  History of smoking  cigarettes: Never smoked no Smoking.  no Alcohol.  Caffeine: yes, 1 serving daily, coffee.  no Recreational drug use.  Diet: low fat.  no Exercise, nothing structured - limited due to her hip.  Occupation: Probation officer.  Education:  Teaching laboratory technician.  Marital Status: married.  Children: 1, son (s) - lives close by.  Seat belt use: yes.    Gyn History  Sexual activity currently sexually active, men only.  Periods : postmenopausal.  LMP started bleeding 06/04/11 and it was like a normal period. Had cramping as well. Nothing Sunday and Monday but spotting yesterday and today..  Birth control BTL.  Last pap smear date 09/29/10.  Last mammogram date planned June 2012.  Abnormal pap smear yes but last 3 have been normal. .  Denies H/O STD .  GYN procedures Pelvic U/S 07/20/11.    OB History  Number of pregnancies 1 - vaginal delivery.  miscarriages 0.  abortion 0.  Pregnancy # 1 live birth, vaginal delivery.    Allergies  Sulfa drugs (for allergy): sores in mouth: Allergy  citric acid: bladder infection: Side Effects   Hospitalization/Major Diagnostic Procedure  childbirth   breast biopsy surgery    Vital Signs  Wt 162, Wt change .3 lb, Ht 62, BMI 29.63, Pulse sitting 77, BP sitting 145/85.   Physical Examination  GENERAL:  Patient appears alert and oriented.  General Appearance: well-appearing, well-developed, no acute distress.  Speech: clear.  NECK:  Thyroid: no thyromegaly.  HEART:  Heart sounds: normal, RRR, no murmur.  ABDOMEN:  General: no masses tenderness or organomegaly, soft, non distended.  FEMALE GENITOURINARY:  General  deferred.  EXTREMITIES:  General: no clubbing, no edema, no calf tenderness.     Assessments   1. Preop examination - V72.84 (Primary)   2. Thickened endometrium - 793.5   3. Endometrial mass - 625.8, Likely polyp   4. Postmenopausal bleeding - 627.1   Treatment  1. Thickened endometrium  Hyst/D&C/polypectomy to rule out endometrial cancer.. R/B/A of procedure discussed with pt. All questions answered.    Follow Up  2 Weeks Post op

## 2011-09-21 NOTE — Preoperative (Signed)
Beta Blockers   Reason not to administer Beta Blockers:Not Applicable 

## 2011-09-21 NOTE — Interval H&P Note (Signed)
History and Physical Interval Note:  09/21/2011 10:56 AM  Mary Munoz  has presented today for surgery, with the diagnosis of Post Menarchal Bleeding  The various methods of treatment have been discussed with the patient and family. After consideration of risks, benefits and other options for treatment, the patient has consented to  Procedure(s) (LRB): DILATATION & CURETTAGE/HYSTEROSCOPY WITH RESECTOCOPE (N/A) as a surgical intervention .  The patients' history has been reviewed, patient examined, no change in status, stable for surgery.  I have reviewed the patients' chart and labs.  Questions were answered to the patient's satisfaction.     Dion Body, Dae Antonucci

## 2011-09-21 NOTE — Anesthesia Postprocedure Evaluation (Signed)
Anesthesia Post Note  Patient: Mary Munoz  Procedure(s) Performed: Procedure(s) (LRB): DILATATION & CURETTAGE/HYSTEROSCOPY WITH RESECTOCOPE (N/A)  Anesthesia type: sab   Patient location: PACU  Post pain: Pain level controlled  Post assessment: Post-op Vital signs reviewed  Last Vitals:  Filed Vitals:   09/21/11 1215  BP: 118/68  Pulse: 59  Temp:   Resp: 16    Post vital signs: Reviewed  Level of consciousness: sedated  Complications: No apparent anesthesia complications

## 2011-09-26 ENCOUNTER — Other Ambulatory Visit: Payer: Self-pay | Admitting: Family Medicine

## 2011-09-26 DIAGNOSIS — Z1231 Encounter for screening mammogram for malignant neoplasm of breast: Secondary | ICD-10-CM

## 2011-10-20 ENCOUNTER — Ambulatory Visit
Admission: RE | Admit: 2011-10-20 | Discharge: 2011-10-20 | Disposition: A | Discharge status: Home / Self Care | Payer: BC Managed Care – PPO | Source: Ambulatory Visit | Attending: Family Medicine | Admitting: Family Medicine

## 2011-10-20 DIAGNOSIS — Z1231 Encounter for screening mammogram for malignant neoplasm of breast: Secondary | ICD-10-CM

## 2012-01-31 ENCOUNTER — Ambulatory Visit: Payer: Self-pay | Admitting: Gynecologic Oncology

## 2012-02-07 ENCOUNTER — Ambulatory Visit: Payer: Self-pay | Admitting: Gynecologic Oncology

## 2012-04-23 ENCOUNTER — Other Ambulatory Visit (HOSPITAL_COMMUNITY)
Admission: RE | Admit: 2012-04-23 | Discharge: 2012-04-23 | Disposition: A | Discharge status: Home / Self Care | Payer: BC Managed Care – PPO | Source: Ambulatory Visit | Attending: Obstetrics and Gynecology | Admitting: Obstetrics and Gynecology

## 2012-04-23 ENCOUNTER — Other Ambulatory Visit: Payer: Self-pay | Admitting: Obstetrics and Gynecology

## 2012-04-23 DIAGNOSIS — Z1151 Encounter for screening for human papillomavirus (HPV): Secondary | ICD-10-CM | POA: Insufficient documentation

## 2012-04-23 DIAGNOSIS — Z01419 Encounter for gynecological examination (general) (routine) without abnormal findings: Secondary | ICD-10-CM | POA: Insufficient documentation

## 2012-04-23 LAB — HM PAP SMEAR: HM Pap smear: NOT DETECTED

## 2012-06-29 ENCOUNTER — Other Ambulatory Visit: Payer: Self-pay

## 2012-06-29 DIAGNOSIS — R0989 Other specified symptoms and signs involving the circulatory and respiratory systems: Secondary | ICD-10-CM

## 2012-07-02 ENCOUNTER — Ambulatory Visit
Admission: RE | Admit: 2012-07-02 | Discharge: 2012-07-02 | Disposition: A | Discharge status: Home / Self Care | Payer: BC Managed Care – PPO | Source: Ambulatory Visit

## 2012-07-02 DIAGNOSIS — R0989 Other specified symptoms and signs involving the circulatory and respiratory systems: Secondary | ICD-10-CM

## 2012-07-06 ENCOUNTER — Other Ambulatory Visit: Payer: Self-pay | Admitting: Orthopedic Surgery

## 2012-07-06 MED ORDER — DEXAMETHASONE SODIUM PHOSPHATE 10 MG/ML IJ SOLN: 10.0000 mg | Freq: Once | INTRAMUSCULAR | Status: DC

## 2012-07-06 MED ORDER — BUPIVACAINE LIPOSOME 1.3 % IJ SUSP: 20.0000 mL | Freq: Once | INTRAMUSCULAR | Status: DC

## 2012-07-06 NOTE — Progress Notes (Signed)
Preoperative surgical orders have been place into the Epic hospital system for Mary Munoz on 07/06/2012, 2:12 PM  by Patrica Duel for surgery on 08/08/2012.  Preop Total Hip - Anterior Approach orders including Experel Injecion, IV Tylenol, and IV Decadron as long as there are no contraindications to the above medications. Avel Peace, PA-C

## 2012-07-26 ENCOUNTER — Encounter (HOSPITAL_COMMUNITY): Payer: Self-pay | Admitting: Pharmacy Technician

## 2012-08-01 ENCOUNTER — Ambulatory Visit (HOSPITAL_COMMUNITY)
Admission: RE | Admit: 2012-08-01 | Discharge: 2012-08-01 | Disposition: A | Discharge status: Home / Self Care | Payer: BC Managed Care – PPO | Source: Ambulatory Visit | Attending: Orthopedic Surgery | Admitting: Orthopedic Surgery

## 2012-08-01 ENCOUNTER — Encounter (HOSPITAL_COMMUNITY)
Admission: RE | Admit: 2012-08-01 | Discharge: 2012-08-01 | Disposition: A | Discharge status: Home / Self Care | Payer: BC Managed Care – PPO | Source: Ambulatory Visit | Attending: Orthopedic Surgery | Admitting: Orthopedic Surgery

## 2012-08-01 ENCOUNTER — Encounter (HOSPITAL_COMMUNITY): Payer: Self-pay

## 2012-08-01 DIAGNOSIS — Z01812 Encounter for preprocedural laboratory examination: Secondary | ICD-10-CM | POA: Insufficient documentation

## 2012-08-01 DIAGNOSIS — Z01818 Encounter for other preprocedural examination: Secondary | ICD-10-CM | POA: Insufficient documentation

## 2012-08-01 DIAGNOSIS — I10 Essential (primary) hypertension: Secondary | ICD-10-CM | POA: Insufficient documentation

## 2012-08-01 DIAGNOSIS — M25559 Pain in unspecified hip: Secondary | ICD-10-CM | POA: Insufficient documentation

## 2012-08-01 HISTORY — DX: Unspecified osteoarthritis, unspecified site: M19.90

## 2012-08-01 HISTORY — DX: Personal history of other infectious and parasitic diseases: Z86.19

## 2012-08-01 HISTORY — DX: Pure hypercholesterolemia, unspecified: E78.00

## 2012-08-01 LAB — CBC
HCT: 41.9 % (ref 36.0–46.0)
Platelets: 327 10*3/uL (ref 150–400)
RDW: 13 % (ref 11.5–15.5)
WBC: 7.1 10*3/uL (ref 4.0–10.5)

## 2012-08-01 LAB — URINALYSIS, ROUTINE W REFLEX MICROSCOPIC
Ketones, ur: NEGATIVE mg/dL
Leukocytes, UA: NEGATIVE
Nitrite: NEGATIVE
Specific Gravity, Urine: 1.017 (ref 1.005–1.030)
Urobilinogen, UA: 0.2 mg/dL (ref 0.0–1.0)
pH: 5.5 (ref 5.0–8.0)

## 2012-08-01 LAB — COMPREHENSIVE METABOLIC PANEL
ALT: 63 U/L — ABNORMAL HIGH (ref 0–35)
AST: 31 U/L (ref 0–37)
Albumin: 3.6 g/dL (ref 3.5–5.2)
Alkaline Phosphatase: 113 U/L (ref 39–117)
Chloride: 98 mEq/L (ref 96–112)
Potassium: 3.8 mEq/L (ref 3.5–5.1)
Sodium: 136 mEq/L (ref 135–145)
Total Bilirubin: 0.4 mg/dL (ref 0.3–1.2)

## 2012-08-01 LAB — APTT: aPTT: 28 seconds (ref 24–37)

## 2012-08-01 NOTE — Pre-Procedure Instructions (Addendum)
EKG REPORT IN EPIC FROM 08/31/11. CXR AND LEFT HIP XRAYS WERE DONE TODAY - PREOP - AT [facility]. NOTE OF MEDICAL CLEARANCE FROM DR. MORROW FAXED BY DR. ALUISIO'S OFFICE AND PLACED ON PT'S CHART.

## 2012-08-01 NOTE — Patient Instructions (Signed)
YOUR SURGERY IS SCHEDULED AT [facility] : [facility]  ON:  WED  4/2  REPORT TO [facility] AT: 12:00 PM      PHONE # FOR SHORT STAY IS [phone]  DO NOT EAT ANYTHING AFTER MIDNIGHT THE NIGHT BEFORE YOUR SURGERY.   NO FOOD, NO CHEWING GUM, NO MINTS, NO CANDIES, NO CHEWING TOBACCO. YOU MAY HAVE CLEAR LIQUIDS TO DRINK FROM MIDNIGHT UNTIL 8:30 AM DAY OF SURGERY - LIKE WATER, COFFEE ( NO MILK OR MILK PRODUCTS ), CRYSTAL LIGHT.   NOTHING TO DRINK AFTER 8:30 AM DAY OF SURGERY.  PLEASE TAKE THE FOLLOWING MEDICATIONS THE AM OF YOUR SURGERY WITH A FEW SIPS OF WATER:  OMEPRAZOLE, SIMVASTATIN  IF YOU USE INHALERS--USE YOUR INHALERS THE AM OF YOUR SURGERY AND BRING INHALERS TO THE HOSPITAL.    IF YOU ARE DIABETIC:  DO NOT TAKE ANY DIABETIC MEDICATIONS THE AM OF YOUR SURGERY.  IF YOU TAKE INSULIN IN THE EVENINGS--PLEASE ONLY TAKE 1/2 NORMAL EVENING DOSE THE NIGHT BEFORE YOUR SURGERY.  NO INSULIN THE AM OF YOUR SURGERY.  IF YOU HAVE SLEEP APNEA AND USE CPAP OR BIPAP--PLEASE BRING THE MASK AND THE TUBING.  DO NOT BRING YOUR MACHINE.  DO NOT BRING VALUABLES, MONEY, CREDIT CARDS.  DO NOT WEAR JEWELRY, MAKE-UP, NAIL POLISH AND NO METAL PINS OR CLIPS IN YOUR HAIR. CONTACT LENS, DENTURES / PARTIALS, GLASSES SHOULD NOT BE WORN TO SURGERY AND IN MOST CASES-HEARING AIDS WILL NEED TO BE REMOVED.  BRING YOUR GLASSES CASE, ANY EQUIPMENT NEEDED FOR YOUR CONTACT LENS. FOR PATIENTS ADMITTED TO THE HOSPITAL--CHECK OUT TIME THE DAY OF DISCHARGE IS 11:00 AM.  ALL INPATIENT ROOMS ARE PRIVATE - WITH BATHROOM, TELEPHONE, TELEVISION AND WIFI INTERNET.  IF YOU ARE BEING DISCHARGED THE SAME DAY OF YOUR SURGERY--YOU CAN NOT DRIVE YOURSELF HOME--AND SHOULD NOT GO HOME ALONE BY TAXI OR BUS.  NO DRIVING OR OPERATING MACHINERY FOR 24 HOURS FOLLOWING ANESTHESIA / PAIN MEDICATIONS.  PLEASE MAKE ARRANGEMENTS FOR SOMEONE TO BE WITH YOU AT HOME THE FIRST 24 HOURS AFTER SURGERY. RESPONSIBLE DRIVER'S  NAME___________________________                                               PHONE #   _______________________                               PLEASE READ OVER ANY  FACT SHEETS THAT YOU WERE GIVEN: MRSA INFORMATION, BLOOD TRANSFUSION INFORMATION, INCENTIVE SPIROMETER INFORMATION. FAILURE TO FOLLOW THESE INSTRUCTIONS MAY RESULT IN THE CANCELLATION OF YOUR SURGERY.   PATIENT SIGNATURE_________________________________

## 2012-08-07 ENCOUNTER — Other Ambulatory Visit: Payer: Self-pay | Admitting: Orthopedic Surgery

## 2012-08-07 MED ORDER — TRANEXAMIC ACID 100 MG/ML IV SOLN: 1000.0000 mg | INTRAVENOUS | Status: DC

## 2012-08-07 NOTE — H&P (Signed)
Mary Munoz  DOB: 1955/09/03 Married / Language: English / Race: White Female  Date of Admission:  08/08/2012  Chief Complaint:  Left Hip Pain  History of Present Illness The patient is a 57 year old female who comes in  for a preoperative History and Physical. The patient is scheduled for a left total hip arthroplasty (Anterior Approach) to be performed by Dr. Gus Rankin. Aluisio, MD at [facility] on 08/08/2012. The patient is a 57 year old female who presents for follow up of their hip. The patient is being followed for their left hip pain and osteoarthritis. They are 7 week(s) out from intra-articular injection. Symptoms reported today include: pain, popping and grinding. The patient feels that they are doing poorly. Current treatment includes: NSAIDs (diclofenac). The following medication has been used for pain control: Ultram. The patient has not gotten any relief of their symptoms with Cortisone injections. The patient states that the hip is hurting her at all times now. It is limiting what she can and cannot do. She would like to exercise and be more active but cannot do so because of her hip. She will get pain at night at times. She is having a harder time sleeping. She is having a harder time getting around even doing activities of daily living. The intraarticular injection provided minimal benefit. I initially saw the patient a few years ago and she had joint space narrowing at that time. She was seen recently by Kenard Gower L. Perkins, PA-C and is now bone-on-bone. She is ready to proceed with surgery. They have been treated conservatively in the past for the above stated problem and despite conservative measures, they continue to have progressive pain and severe functional limitations and dysfunction. They have failed non-operative management including home exercise, medications, and injections. It is felt that they would benefit from undergoing total joint replacement. Risks  and benefits of the procedure have been discussed with the patient and they elect to proceed with surgery. There are no active contraindications to surgery such as ongoing infection or rapidly progressive neurological disease.   Problem List Osteoarthritis, Hip (715.35)   Allergies Sulfanilamide *CHEMICALS* Citric Acid *CHEMICALS*   Family History Chronic Obstructive Lung Disease. father Cerebrovascular Accident. father Heart Disease. father Diabetes Mellitus. mother Cancer. sister, grandmother fathers side and grandfather fathers side Osteoarthritis. mother Hypertension. mother, father and sister Osteoporosis. mother   Social History Living situation. live with spouse Illicit drug use. no Most recent primary occupation. clerk Marital status. married Current work status. working full time Children. 1 Exercise. Exercises rarely; does running / walking Drug/Alcohol Rehab (Currently). no Tobacco use. never smoker Pain Contract. no Number of flights of stairs before winded. 4-5 Tobacco / smoke exposure. no Previously in rehab. no Alcohol use. never consumed alcohol   Medication History Simvastatin (20MG  Tablet, Oral) Active. Diclofenac Sodium (75MG  Tablet DR, Oral) Active. TraMADol HCl (50MG  Tablet, Oral) Active. Omeprazole (20MG  Tablet DR, Oral) Active. Triamcinolone Acetonide (0.1% Cream, External) Active. Estradiol (1MG  Tablet, Oral) Active. Prometrium (100MG  Capsule, Oral) Active. Lisinopril-Hydrochlorothiazide (10-12.5MG  Tablet, Oral) Active.   Past Surgical History Breast Biopsy. Date: 80. Tubal Ligation. Date: 18. Dilation and Curettage of Uterus. Date: 09/2011.   Medical History Hypercholesterolemia High blood pressure Sleep Apnea. uses CPAP Chronic Pain Shingles Urinary Tract Infection Measles Mumps Menopause   Review of Systems General:Not Present- Chills, Fever, Night Sweats, Fatigue, Weight Gain, Weight  Loss and Memory Loss. Skin:Not Present- Hives, Itching, Rash, Eczema and Lesions. HEENT:Not Present- Tinnitus, Headache, Double Vision,  Visual Loss, Hearing Loss and Dentures. Respiratory:Not Present- Shortness of breath with exertion, Shortness of breath at rest, Allergies, Coughing up blood and Chronic Cough. Cardiovascular:Not Present- Chest Pain, Racing/skipping heartbeats, Difficulty Breathing Lying Down, Murmur, Swelling and Palpitations. Gastrointestinal:Not Present- Bloody Stool, Heartburn, Abdominal Pain, Vomiting, Nausea, Constipation, Diarrhea, Difficulty Swallowing, Jaundice and Loss of appetitie. Female Genitourinary:Not Present- Blood in Urine, Urinary frequency, Weak urinary stream, Discharge, Flank Pain, Incontinence, Painful Urination, Urgency, Urinary Retention and Urinating at Night. Musculoskeletal:Present- Joint Pain. Not Present- Muscle Weakness, Muscle Pain, Joint Swelling, Back Pain, Morning Stiffness and Spasms. Neurological:Not Present- Tremor, Dizziness, Blackout spells, Paralysis, Difficulty with balance and Weakness. Psychiatric:Not Present- Insomnia.   Vitals Pulse: 84 (Regular) Resp.: 16 (Unlabored) BP: 138/86 (Sitting, Right Arm, Standard)    Physical Exam The physical exam findings are as follows:  Note: Patient is a 57 year old female with continued hip pain.   General Mental Status - Alert, cooperative and good historian. General Appearance- pleasant. Not in acute distress. Orientation- Oriented X3. Build & Nutrition- Well nourished and Well developed.   Head and Neck Head- normocephalic, atraumatic . Neck Global Assessment- supple. no bruit auscultated on the right and no bruit auscultated on the left.   Eye Vision- Wears contact lenses. Pupil- Bilateral- Regular and Round. Motion- Bilateral- EOMI.   Chest and Lung Exam Auscultation: Breath sounds:- clear at anterior chest wall and - clear at posterior  chest wall. Adventitious sounds:- No Adventitious sounds.   Cardiovascular Auscultation:Rhythm- Regular rate and rhythm. Heart Sounds- S1 WNL and S2 WNL. Murmurs & Other Heart Sounds:Auscultation of the heart reveals - No Murmurs.   Abdomen Palpation/Percussion:Tenderness- Abdomen is non-tender to palpation. Rigidity (guarding)- Abdomen is soft. Auscultation:Auscultation of the abdomen reveals - Bowel sounds normal.   Female Genitourinary Not done, not pertinent to present illness  Musculoskeletal Patient is a 57 YO white female, well nourished, well developed, in no acute distress. She is slow getting off and on the examination table. Back is examined. She's nontender about the cervical, thoracic and lumbosacral area, nontender over the perilumbar muscles. She has good trunk motion with no significant pain. She is able to stand heel and toe and maintain position. In the right hip, she has normal, pain-free passive motion with flexion up over 120 degrees, internal rotation 25, external rotation 30, abduction 35. The left hip has significant decreased ROM with only flexion of 100 degrees. She has 0 internal rotation, about 25 degrees of external rotation, about 20 degrees of abduction. She does have pain noted on attempted internal rotation. She is neurovascularly intact. Sensation is intact in both lower extremities. Pulses are noted to be intact.   RADIOGRAPHS: AP pelvis of the hip shows well-maintained joint space of the right hip, but on the left hip she has significant complete loss of the femoral acetabular joint space throughout with flattening of the femoral head. She has cystic changes in the acetabulum, also the femoral head. This is also confirmed on the lateral view with significant cystic changes in the femoral head area of the acetabulum with complete loss of the joint space and large femoral head spurring. She has dysplastic changes of the acetabulum and  her femoral head is riding above the normal center of rotation. She is about one-half inch short on the left. 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  Assessment & Plan Osteoarthritis, Hip (715.35) Impression: Left Hip  Note: Plan is for a Left Total Hip Replacement - Anterior Approach by Dr. Lequita Halt.  Plan is to go to [facility] following the surgery.  PCP - Dr. Farris Has Deboraha Sprang at [facility]  Signed electronically by Roberts Gaudy, PA-C

## 2012-08-08 ENCOUNTER — Encounter (HOSPITAL_COMMUNITY): Payer: Self-pay | Admitting: Anesthesiology

## 2012-08-08 ENCOUNTER — Inpatient Hospital Stay (HOSPITAL_COMMUNITY): Payer: BC Managed Care – PPO

## 2012-08-08 ENCOUNTER — Encounter (HOSPITAL_COMMUNITY): Payer: Self-pay | Admitting: *Deleted

## 2012-08-08 ENCOUNTER — Inpatient Hospital Stay (HOSPITAL_COMMUNITY): Payer: BC Managed Care – PPO | Admitting: Anesthesiology

## 2012-08-08 ENCOUNTER — Inpatient Hospital Stay (HOSPITAL_COMMUNITY)
Admission: RE | Admit: 2012-08-08 | Discharge: 2012-08-10 | DRG: 818 | Disposition: A | Discharge status: Skilled Nursing Facility | Payer: BC Managed Care – PPO | Source: Ambulatory Visit | Attending: Orthopedic Surgery | Admitting: Orthopedic Surgery

## 2012-08-08 ENCOUNTER — Encounter (HOSPITAL_COMMUNITY)
Admission: RE | Disposition: A | Discharge status: Skilled Nursing Facility | Payer: Self-pay | Source: Ambulatory Visit | Attending: Orthopedic Surgery

## 2012-08-08 DIAGNOSIS — I493 Ventricular premature depolarization: Secondary | ICD-10-CM

## 2012-08-08 DIAGNOSIS — M169 Osteoarthritis of hip, unspecified: Principal | ICD-10-CM | POA: Diagnosis present

## 2012-08-08 DIAGNOSIS — G4730 Sleep apnea, unspecified: Secondary | ICD-10-CM | POA: Diagnosis present

## 2012-08-08 DIAGNOSIS — I498 Other specified cardiac arrhythmias: Secondary | ICD-10-CM | POA: Diagnosis not present

## 2012-08-08 DIAGNOSIS — M1610 Unilateral primary osteoarthritis, unspecified hip: Principal | ICD-10-CM | POA: Diagnosis present

## 2012-08-08 DIAGNOSIS — E876 Hypokalemia: Secondary | ICD-10-CM | POA: Diagnosis not present

## 2012-08-08 DIAGNOSIS — E7800 Pure hypercholesterolemia, unspecified: Secondary | ICD-10-CM | POA: Diagnosis present

## 2012-08-08 DIAGNOSIS — I499 Cardiac arrhythmia, unspecified: Secondary | ICD-10-CM | POA: Insufficient documentation

## 2012-08-08 DIAGNOSIS — Z96649 Presence of unspecified artificial hip joint: Secondary | ICD-10-CM

## 2012-08-08 DIAGNOSIS — I10 Essential (primary) hypertension: Secondary | ICD-10-CM | POA: Diagnosis present

## 2012-08-08 DIAGNOSIS — D62 Acute posthemorrhagic anemia: Secondary | ICD-10-CM | POA: Diagnosis not present

## 2012-08-08 HISTORY — DX: Cardiac arrhythmia, unspecified: I49.9

## 2012-08-08 HISTORY — PX: TOTAL HIP ARTHROPLASTY: SHX124

## 2012-08-08 LAB — CK TOTAL AND CKMB (NOT AT ARMC)
CK, MB: 4.2 ng/mL — ABNORMAL HIGH (ref 0.3–4.0)
Total CK: 279 U/L — ABNORMAL HIGH (ref 7–177)

## 2012-08-08 LAB — TYPE AND SCREEN

## 2012-08-08 SURGERY — ARTHROPLASTY, HIP, TOTAL, ANTERIOR APPROACH
Anesthesia: General | Site: Hip | Laterality: Left | Wound class: Clean

## 2012-08-08 MED ORDER — 0.9 % SODIUM CHLORIDE (POUR BTL) OPTIME
TOPICAL | Status: DC | PRN
  Administered 2012-08-08: 1000 mL

## 2012-08-08 MED ORDER — DEXAMETHASONE SODIUM PHOSPHATE 10 MG/ML IJ SOLN
10.0000 mg | Freq: Once | INTRAMUSCULAR | Status: AC
  Filled 2012-08-08: qty 1

## 2012-08-08 MED ORDER — BUPIVACAINE LIPOSOME 1.3 % IJ SUSP
20.0000 mL | Freq: Once | INTRAMUSCULAR | Status: DC
  Filled 2012-08-08: qty 20

## 2012-08-08 MED ORDER — KETAMINE HCL 10 MG/ML IJ SOLN
INTRAMUSCULAR | Status: DC | PRN
  Administered 2012-08-08: 15 mg via INTRAVENOUS

## 2012-08-08 MED ORDER — RIVAROXABAN 10 MG PO TABS
10.0000 mg | ORAL_TABLET | Freq: Every day | ORAL | Status: DC
  Administered 2012-08-09 – 2012-08-10 (×2): 10 mg via ORAL
  Filled 2012-08-08 (×3): qty 1

## 2012-08-08 MED ORDER — ONDANSETRON HCL 4 MG/2ML IJ SOLN
INTRAMUSCULAR | Status: DC | PRN
  Administered 2012-08-08: 2 mg via INTRAVENOUS

## 2012-08-08 MED ORDER — MIDAZOLAM HCL 5 MG/5ML IJ SOLN
INTRAMUSCULAR | Status: DC | PRN
  Administered 2012-08-08: 2 mg via INTRAVENOUS

## 2012-08-08 MED ORDER — METHOCARBAMOL 100 MG/ML IJ SOLN: 500.0000 mg | Freq: Four times a day (QID) | INTRAVENOUS | Status: DC | PRN

## 2012-08-08 MED ORDER — SODIUM CHLORIDE 0.9 % IV SOLN
INTRAVENOUS | Status: DC
  Administered 2012-08-08: 19:00:00 via INTRAVENOUS
  Administered 2012-08-09: 20 mL/h via INTRAVENOUS

## 2012-08-08 MED ORDER — CHLORHEXIDINE GLUCONATE 4 % EX LIQD: 60.0000 mL | Freq: Once | CUTANEOUS | Status: DC

## 2012-08-08 MED ORDER — DEXAMETHASONE SODIUM PHOSPHATE 10 MG/ML IJ SOLN
INTRAMUSCULAR | Status: DC | PRN
  Administered 2012-08-08: 10 mg via INTRAVENOUS

## 2012-08-08 MED ORDER — LACTATED RINGERS IV SOLN
INTRAVENOUS | Status: DC
  Administered 2012-08-08: 18:00:00 via INTRAVENOUS

## 2012-08-08 MED ORDER — SIMVASTATIN 20 MG PO TABS
20.0000 mg | ORAL_TABLET | Freq: Every morning | ORAL | Status: DC
  Administered 2012-08-09 – 2012-08-10 (×2): 20 mg via ORAL
  Filled 2012-08-08 (×2): qty 1

## 2012-08-08 MED ORDER — GLYCOPYRROLATE 0.2 MG/ML IJ SOLN
INTRAMUSCULAR | Status: DC | PRN
  Administered 2012-08-08: 0.4 mg via INTRAVENOUS

## 2012-08-08 MED ORDER — SODIUM CHLORIDE 0.9 % IJ SOLN
INTRAMUSCULAR | Status: DC | PRN
  Administered 2012-08-08: 16:00:00

## 2012-08-08 MED ORDER — EPHEDRINE SULFATE 50 MG/ML IJ SOLN
INTRAMUSCULAR | Status: DC | PRN
  Administered 2012-08-08 (×3): 5 mg via INTRAVENOUS

## 2012-08-08 MED ORDER — METHOCARBAMOL 500 MG PO TABS
500.0000 mg | ORAL_TABLET | Freq: Four times a day (QID) | ORAL | Status: DC | PRN
  Administered 2012-08-09: 500 mg via ORAL
  Filled 2012-08-08 (×2): qty 1

## 2012-08-08 MED ORDER — PANTOPRAZOLE SODIUM 40 MG PO TBEC
40.0000 mg | DELAYED_RELEASE_TABLET | Freq: Every day | ORAL | Status: DC
  Filled 2012-08-08: qty 1

## 2012-08-08 MED ORDER — ACETAMINOPHEN 10 MG/ML IV SOLN
1000.0000 mg | Freq: Once | INTRAVENOUS | Status: AC
  Administered 2012-08-08: 1000 mg via INTRAVENOUS

## 2012-08-08 MED ORDER — METOCLOPRAMIDE HCL 10 MG PO TABS: 5.0000 mg | ORAL_TABLET | Freq: Three times a day (TID) | ORAL | Status: DC | PRN

## 2012-08-08 MED ORDER — FENTANYL CITRATE 0.05 MG/ML IJ SOLN
INTRAMUSCULAR | Status: DC | PRN
  Administered 2012-08-08: 100 ug via INTRAVENOUS
  Administered 2012-08-08: 25 ug via INTRAVENOUS
  Administered 2012-08-08: 50 ug via INTRAVENOUS
  Administered 2012-08-08: 25 ug via INTRAVENOUS
  Administered 2012-08-08: 100 ug via INTRAVENOUS
  Administered 2012-08-08 (×2): 25 ug via INTRAVENOUS

## 2012-08-08 MED ORDER — ONDANSETRON HCL 4 MG/2ML IJ SOLN: 4.0000 mg | Freq: Four times a day (QID) | INTRAMUSCULAR | Status: DC | PRN

## 2012-08-08 MED ORDER — TRAMADOL HCL 50 MG PO TABS: 50.0000 mg | ORAL_TABLET | Freq: Four times a day (QID) | ORAL | Status: DC | PRN

## 2012-08-08 MED ORDER — DIPHENHYDRAMINE HCL 12.5 MG/5ML PO ELIX: 12.5000 mg | ORAL_SOLUTION | ORAL | Status: DC | PRN

## 2012-08-08 MED ORDER — ONDANSETRON HCL 4 MG PO TABS: 4.0000 mg | ORAL_TABLET | Freq: Four times a day (QID) | ORAL | Status: DC | PRN

## 2012-08-08 MED ORDER — SUCCINYLCHOLINE CHLORIDE 20 MG/ML IJ SOLN
INTRAMUSCULAR | Status: DC | PRN
  Administered 2012-08-08: 100 mg via INTRAVENOUS

## 2012-08-08 MED ORDER — HYDROMORPHONE HCL PF 1 MG/ML IJ SOLN: 0.2500 mg | INTRAMUSCULAR | Status: DC | PRN

## 2012-08-08 MED ORDER — ESMOLOL HCL 10 MG/ML IV SOLN
INTRAVENOUS | Status: DC | PRN
  Administered 2012-08-08: 10 mg via INTRAVENOUS
  Administered 2012-08-08 (×3): 20 mg via INTRAVENOUS

## 2012-08-08 MED ORDER — LACTATED RINGERS IV SOLN
INTRAVENOUS | Status: DC
  Administered 2012-08-08: 1000 mL via INTRAVENOUS
  Administered 2012-08-08: 15:00:00 via INTRAVENOUS

## 2012-08-08 MED ORDER — FLEET ENEMA 7-19 GM/118ML RE ENEM: 1.0000 | ENEMA | Freq: Once | RECTAL | Status: AC | PRN

## 2012-08-08 MED ORDER — METOCLOPRAMIDE HCL 5 MG/ML IJ SOLN: 5.0000 mg | Freq: Three times a day (TID) | INTRAMUSCULAR | Status: DC | PRN

## 2012-08-08 MED ORDER — HYDROMORPHONE HCL PF 1 MG/ML IJ SOLN
INTRAMUSCULAR | Status: DC | PRN
  Administered 2012-08-08: 2 mg via INTRAVENOUS

## 2012-08-08 MED ORDER — DOCUSATE SODIUM 100 MG PO CAPS
100.0000 mg | ORAL_CAPSULE | Freq: Two times a day (BID) | ORAL | Status: DC
  Administered 2012-08-08 – 2012-08-10 (×4): 100 mg via ORAL
  Filled 2012-08-08 (×5): qty 1

## 2012-08-08 MED ORDER — OXYCODONE HCL 5 MG PO TABS
5.0000 mg | ORAL_TABLET | ORAL | Status: DC | PRN
  Administered 2012-08-09: 5 mg via ORAL
  Administered 2012-08-09: 10 mg via ORAL
  Administered 2012-08-09 (×2): 5 mg via ORAL
  Administered 2012-08-10: 10 mg via ORAL
  Administered 2012-08-10: 5 mg via ORAL
  Administered 2012-08-10: 10 mg via ORAL
  Filled 2012-08-08: qty 2
  Filled 2012-08-08: qty 5
  Filled 2012-08-08: qty 2
  Filled 2012-08-08 (×3): qty 1
  Filled 2012-08-08: qty 2
  Filled 2012-08-08: qty 1

## 2012-08-08 MED ORDER — ROCURONIUM BROMIDE 100 MG/10ML IV SOLN
INTRAVENOUS | Status: DC | PRN
  Administered 2012-08-08: 20 mg via INTRAVENOUS

## 2012-08-08 MED ORDER — BISACODYL 10 MG RE SUPP: 10.0000 mg | Freq: Every day | RECTAL | Status: DC | PRN

## 2012-08-08 MED ORDER — MENTHOL 3 MG MT LOZG
1.0000 | LOZENGE | OROMUCOSAL | Status: DC | PRN
  Filled 2012-08-08: qty 9

## 2012-08-08 MED ORDER — SODIUM CHLORIDE 0.9 % IV SOLN: INTRAVENOUS | Status: DC

## 2012-08-08 MED ORDER — PHENOL 1.4 % MT LIQD
1.0000 | OROMUCOSAL | Status: DC | PRN
  Filled 2012-08-08: qty 177

## 2012-08-08 MED ORDER — POLYETHYLENE GLYCOL 3350 17 G PO PACK
17.0000 g | PACK | Freq: Every day | ORAL | Status: DC | PRN
  Filled 2012-08-08: qty 1

## 2012-08-08 MED ORDER — ACETAMINOPHEN 650 MG RE SUPP: 650.0000 mg | Freq: Four times a day (QID) | RECTAL | Status: DC | PRN

## 2012-08-08 MED ORDER — CEFAZOLIN SODIUM-DEXTROSE 2-3 GM-% IV SOLR
2.0000 g | INTRAVENOUS | Status: AC
  Administered 2012-08-08: 2 g via INTRAVENOUS

## 2012-08-08 MED ORDER — CEFAZOLIN SODIUM 1-5 GM-% IV SOLN
1.0000 g | Freq: Four times a day (QID) | INTRAVENOUS | Status: AC
  Administered 2012-08-08 – 2012-08-09 (×2): 1 g via INTRAVENOUS
  Filled 2012-08-08 (×2): qty 50

## 2012-08-08 MED ORDER — ACETAMINOPHEN 325 MG PO TABS: 650.0000 mg | ORAL_TABLET | Freq: Four times a day (QID) | ORAL | Status: DC | PRN

## 2012-08-08 MED ORDER — DEXAMETHASONE 6 MG PO TABS
10.0000 mg | ORAL_TABLET | Freq: Once | ORAL | Status: AC
  Administered 2012-08-09: 10 mg via ORAL
  Filled 2012-08-08: qty 1

## 2012-08-08 MED ORDER — NEOSTIGMINE METHYLSULFATE 1 MG/ML IJ SOLN
INTRAMUSCULAR | Status: DC | PRN
  Administered 2012-08-08: 5 mg via INTRAVENOUS

## 2012-08-08 MED ORDER — LIDOCAINE HCL (CARDIAC) 20 MG/ML IV SOLN
INTRAVENOUS | Status: DC | PRN
  Administered 2012-08-08: 100 mg via INTRAVENOUS

## 2012-08-08 MED ORDER — MORPHINE SULFATE 2 MG/ML IJ SOLN: 1.0000 mg | INTRAMUSCULAR | Status: DC | PRN

## 2012-08-08 MED ORDER — PROPOFOL 10 MG/ML IV BOLUS
INTRAVENOUS | Status: DC | PRN
  Administered 2012-08-08: 200 mg via INTRAVENOUS

## 2012-08-08 SURGICAL SUPPLY — 48 items
BAG SPEC THK2 15X12 ZIP CLS (MISCELLANEOUS) ×2
BAG ZIPLOCK 12X15 (MISCELLANEOUS) ×4 IMPLANT
BLADE SAW SGTL 18X1.27X75 (BLADE) ×2 IMPLANT
CLOTH BEACON ORANGE TIMEOUT ST (SAFETY) ×2 IMPLANT
CLSR STERI-STRIP ANTIMIC 1/2X4 (GAUZE/BANDAGES/DRESSINGS) ×4 IMPLANT
DECANTER SPIKE VIAL GLASS SM (MISCELLANEOUS) ×2 IMPLANT
DRAPE C-ARM 42X72 X-RAY (DRAPES) ×2 IMPLANT
DRAPE STERI IOBAN 125X83 (DRAPES) ×2 IMPLANT
DRAPE U-SHAPE 47X51 STRL (DRAPES) ×6 IMPLANT
DRSG ADAPTIC 3X8 NADH LF (GAUZE/BANDAGES/DRESSINGS) ×2 IMPLANT
DRSG MEPILEX BORDER 4X4 (GAUZE/BANDAGES/DRESSINGS) ×1 IMPLANT
DRSG MEPILEX BORDER 4X8 (GAUZE/BANDAGES/DRESSINGS) ×2 IMPLANT
DURAPREP 26ML APPLICATOR (WOUND CARE) ×2 IMPLANT
ELECT BLADE 6.5 EXT (BLADE) ×2 IMPLANT
ELECT REM PT RETURN 9FT ADLT (ELECTROSURGICAL) ×2
ELECTRODE REM PT RTRN 9FT ADLT (ELECTROSURGICAL) ×1 IMPLANT
EVACUATOR 1/8 PVC DRAIN (DRAIN) ×1 IMPLANT
FACESHIELD LNG OPTICON STERILE (SAFETY) ×9 IMPLANT
GLOVE BIO SURGEON STRL SZ7.5 (GLOVE) ×2 IMPLANT
GLOVE BIO SURGEON STRL SZ8 (GLOVE) ×4 IMPLANT
GLOVE BIOGEL PI IND STRL 7.0 (GLOVE) IMPLANT
GLOVE BIOGEL PI IND STRL 8 (GLOVE) ×2 IMPLANT
GLOVE BIOGEL PI INDICATOR 7.0 (GLOVE) ×1
GLOVE BIOGEL PI INDICATOR 8 (GLOVE) ×2
GLOVE SURG SS PI 6.5 STRL IVOR (GLOVE) ×1 IMPLANT
GLOVE SURG SS PI 7.5 STRL IVOR (GLOVE) ×2 IMPLANT
GOWN STRL NON-REIN LRG LVL3 (GOWN DISPOSABLE) ×2 IMPLANT
GOWN STRL REIN XL XLG (GOWN DISPOSABLE) ×4 IMPLANT
KIT BASIN OR (CUSTOM PROCEDURE TRAY) ×2 IMPLANT
NDL SAFETY ECLIPSE 18X1.5 (NEEDLE) ×1 IMPLANT
NEEDLE HYPO 18GX1.5 SHARP (NEEDLE) ×2
NS IRRIG 1000ML POUR BTL (IV SOLUTION) ×1 IMPLANT
PACK TOTAL JOINT (CUSTOM PROCEDURE TRAY) ×2 IMPLANT
PADDING CAST COTTON 6X4 STRL (CAST SUPPLIES) ×2 IMPLANT
SPONGE GAUZE 4X4 12PLY (GAUZE/BANDAGES/DRESSINGS) ×2 IMPLANT
STRIP CLOSURE SKIN 1/2X4 (GAUZE/BANDAGES/DRESSINGS) ×1 IMPLANT
SUCTION FRAZIER 12FR DISP (SUCTIONS) ×2 IMPLANT
SUT ETHIBOND NAB CT1 #1 30IN (SUTURE) ×7 IMPLANT
SUT MNCRL AB 4-0 PS2 18 (SUTURE) ×2 IMPLANT
SUT VIC AB 1 CT1 27 (SUTURE) ×2
SUT VIC AB 1 CT1 27XBRD ANTBC (SUTURE) ×1 IMPLANT
SUT VIC AB 2-0 CT1 27 (SUTURE) ×4
SUT VIC AB 2-0 CT1 TAPERPNT 27 (SUTURE) ×2 IMPLANT
SUT VLOC 180 0 24IN GS25 (SUTURE) ×3 IMPLANT
SYR 50ML LL SCALE MARK (SYRINGE) ×2 IMPLANT
TOWEL OR 17X26 10 PK STRL BLUE (TOWEL DISPOSABLE) ×4 IMPLANT
TRAY FOLEY CATH 14FRSI W/METER (CATHETERS) ×2 IMPLANT
WATER STERILE IRR 1500ML POUR (IV SOLUTION) ×2 IMPLANT

## 2012-08-08 NOTE — Progress Notes (Signed)
Spoke with pt in regards to nighttime CPAP ordered. Placed machine at bedside set to auto titration with humidification. Pt has brought in home tubing/nasal pillows. Pt requests to be placed on around 2300.

## 2012-08-08 NOTE — Progress Notes (Signed)
Avel Peace, PA  For Dr. Lequita Halt and Dr. Leta Jungling made aware of patient's lab results in PACU.

## 2012-08-08 NOTE — Progress Notes (Signed)
CK TOTAL AND CKMB AND TROPONIN 1 DRAWN BY LAB.

## 2012-08-08 NOTE — Op Note (Signed)
OPERATIVE REPORT  PREOPERATIVE DIAGNOSIS: Osteoarthritis of the Left hip.   POSTOPERATIVE DIAGNOSIS: Osteoarthritis of the Left  hip.   PROCEDURE: Left total hip arthroplasty, anterior approach.   SURGEON: Ollen Gross, MD   ASSISTANT: Avel Peace, PA-C  ANESTHESIA:  General  ESTIMATED BLOOD LOSS:- 500 ml   DRAINS: Hemovac x1.   COMPLICATIONS: None   CONDITION: PACU - hemodynamically stable.   BRIEF CLINICAL NOTE: Mary Munoz is a 57 y.o. female who has advanced end-  stage arthritis of his Left  hip with progressively worsening pain and  dysfunction.The patient has failed nonoperative management and presents for  total hip arthroplasty.   PROCEDURE IN DETAIL: After successful administration of spinal  anesthetic, the traction boots for the [facility] bed were placed on both  feet and the patient was placed onto the [facility] bed, boots placed into the leg  holders. The Left hip was then isolated from the perineum with plastic  drapes and prepped and draped in the usual sterile fashion. ASIS and  greater trochanter were marked and a oblique incision was made, starting  at about 1 cm lateral and 2 cm distal to the ASIS and coursing towards  the anterior cortex of the femur. The skin was cut with a 10 blade  through subcutaneous tissue to the level of the fascia overlying the  tensor fascia lata muscle. The fascia was then incised in line with the  incision at the junction of the anterior third and posterior 2/3rd. The  muscle was teased off the fascia and then the interval between the TFL  and the rectus was developed. The Hohmann retractor was then placed at  the top of the femoral neck over the capsule. The vessels overlying the  capsule were cauterized and the fat on top of the capsule was removed.  A Hohmann retractor was then placed anterior underneath the rectus  femoris to give exposure to the entire anterior capsule. A T-shaped  capsulotomy was performed.  The edges were tagged and the femoral head  was identified.       Osteophytes are removed off the superior acetabulum.  The femoral neck was then cut in situ with an oscillating saw. Traction  was then applied to the left lower extremity utilizing the [facility]  traction. The femoral head was then removed. Retractors were placed  around the acetabulum and then circumferential removal of the labrum was  performed. Osteophytes were also removed. Reaming starts at 45 mm to  medialize and  Increased in 2 mm increments to 53 mm. We reamed in  approximately 40 degrees of abduction, 20 degrees anteversion. A 54 mm  pinnacle acetabular shell was then impacted in anatomic position under  fluoroscopic guidance with excellent purchase. We did not need to place  any additional dome screws. A 36 mm neutral + 4 marathon liner was then  placed into the acetabular shell.       The femoral lift was then placed along the lateral aspect of the femur  just distal to the vastus ridge. The leg was  externally rotated and capsule  was stripped off the inferior aspect of the femoral neck down to the  level of the lesser trochanter, this was done with electrocautery. The femur was lifted after this was performed. The  leg was then placed and extended in adducted position to essentially delivering the femur. We also removed the capsule superiorly and the  piriformis from the  piriformis fossa to gain excellent exposure of the  proximal femur. Rongeur was used to remove some cancellous bone to get  into the lateral portion of the proximal femur for placement of the  initial starter reamer. The starter broaches was placed  the starter broach  and was shown to go down the center of the canal. Broaching  with the  Corail system was then performed starting at size 8, coursing  Up to size 12. A size 12 had excellent torsional and rotational  and axial stability. The trial standard offset neck was then placed  with a 36 + 5  trial head. The hip was then reduced. We confirmed that  the stem was in the canal both on AP and lateral x-rays. It also has excellent sizing. The hip was reduced with outstanding stability through full extension, full external rotation,  and then flexion in adduction internal rotation. AP pelvis was taken  and the leg lengths were measured and found to be exactly equal. Hip  was then dislocated again and the femoral head and neck removed. The  femoral broach was removed. Size 12 Corail stem with a standard offset  neck was then impacted into the femur following native anteversion. Has  excellent purchase in the canal. Excellent torsional and rotational and  axial stability. It is confirmed to be in the canal on AP and lateral  fluoroscopic views. The 36 + 5 ceramic head was placed and the hip  reduced with outstanding stability. Again AP pelvis was taken and it  confirmed that the leg lengths were equal. The wound was then copiously  irrigated with saline solution and the capsule reattached and repaired  with Ethibond suture.  20 mL of Exparel mixed with 50 mL of saline injected  into the capsule and into the edge of the tensor fascia lata as well as  subcutaneous tissue. The fascia overlying the tensor fascia lata was  then closed with a running #1 V-Loc. Subcu was closed with interrupted  2-0 Vicryl and subcuticular running 4-0 Monocryl. Incision was cleaned  and dried. Steri-Strips and a bulky sterile dressing applied. Hemovac  drain was hooked to suction and then he was awakened and transported to  recovery in stable condition.        Please note that a surgical assistant was a medical necessity for this procedure to perform it in a safe and expeditious manner. Assistant was necessary to provide appropriate retraction of vital neurovascular structures and to prevent femoral fracture and allow for anatomic placement of the prosthesis.  Ollen Gross, M.D.

## 2012-08-08 NOTE — Anesthesia Preprocedure Evaluation (Addendum)
Anesthesia Evaluation  Patient identified by MRN, date of birth, ID band Patient awake    Reviewed: Allergy & Precautions, H&P , NPO status , Patient's Chart, lab work & pertinent test results  Airway Mallampati: III TM Distance: >3 FB Neck ROM: full    Dental no notable dental hx. (+) Dental Advisory Given and Teeth Intact   Pulmonary sleep apnea and Continuous Positive Airway Pressure Ventilation ,  breath sounds clear to auscultation  Pulmonary exam normal       Cardiovascular hypertension, Pt. on medications Rhythm:regular Rate:Normal  LBBB   Neuro/Psych negative neurological ROS  negative psych ROS   GI/Hepatic negative GI ROS, Neg liver ROS, GERD-  Medicated and Controlled,  Endo/Other  negative endocrine ROS  Renal/GU negative Renal ROS  negative genitourinary   Musculoskeletal   Abdominal   Peds  Hematology negative hematology ROS (+)   Anesthesia Other Findings   Reproductive/Obstetrics negative OB ROS                          Anesthesia Physical Anesthesia Plan  ASA: III  Anesthesia Plan: General   Post-op Pain Management:    Induction: Intravenous  Airway Management Planned: Oral ETT  Additional Equipment:   Intra-op Plan:   Post-operative Plan: Extubation in OR  Informed Consent: I have reviewed the patients History and Physical, chart, labs and discussed the procedure including the risks, benefits and alternatives for the proposed anesthesia with the patient or authorized representative who has indicated his/her understanding and acceptance.   Dental Advisory Given  Plan Discussed with: CRNA and Surgeon  Anesthesia Plan Comments:         Anesthesia Quick Evaluation

## 2012-08-08 NOTE — Progress Notes (Signed)
Portable AP Pelvis X-ray  Done. 

## 2012-08-08 NOTE — Progress Notes (Signed)
X-RAY RESULTS NOTED. 

## 2012-08-08 NOTE — Anesthesia Postprocedure Evaluation (Signed)
  Anesthesia Post-op Note  Patient: Mary Munoz  Procedure(s) Performed: Procedure(s) (LRB): TOTAL HIP ARTHROPLASTY ANTERIOR APPROACH (Left)  Patient Location: PACU  Anesthesia Type: General  Level of Consciousness: awake and alert   Airway and Oxygen Therapy: Patient Spontanous Breathing  Post-op Pain: mild  Post-op Assessment: Post-op Vital signs reviewed, Patient's Cardiovascular Status Stable, Respiratory Function Stable, Patent Airway and No signs of Nausea or vomiting  Last Vitals:  Filed Vitals:   08/08/12 1745  BP: 131/68  Pulse: 73  Temp: 36.7 C  Resp: 11    Post-op Vital Signs: stable   Complications: No apparent anesthesia complications

## 2012-08-08 NOTE — Interval H&P Note (Signed)
History and Physical Interval Note:  08/08/2012 1:21 PM  Mary Munoz  has presented today for surgery, with the diagnosis of OSTEOARTHRITIS LEFT HIP  The various methods of treatment have been discussed with the patient and family. After consideration of risks, benefits and other options for treatment, the patient has consented to  Procedure(s): TOTAL HIP ARTHROPLASTY ANTERIOR APPROACH (Left) as a surgical intervention .  The patient's history has been reviewed, patient examined, no change in status, stable for surgery.  I have reviewed the patient's chart and labs.  Questions were answered to the patient's satisfaction.     Loanne Drilling

## 2012-08-08 NOTE — Transfer of Care (Signed)
Immediate Anesthesia Transfer of Care Note  Patient: Mary Munoz  Procedure(s) Performed: Procedure(s): TOTAL HIP ARTHROPLASTY ANTERIOR APPROACH (Left)  Patient Location: PACU  Anesthesia Type:General  Level of Consciousness: awake, alert , patient cooperative and responds to stimulation  Airway & Oxygen Therapy: Patient Spontanous Breathing and Patient connected to face mask oxygen  Post-op Assessment: Report given to PACU RN, Post -op Vital signs reviewed and stable and Patient moving all extremities X 4  Post vital signs: Reviewed and stable  Complications: No apparent anesthesia complications

## 2012-08-08 NOTE — Consult Note (Signed)
Admit date: 08/08/2012 Referring Physician  Dr. Despina Hick Primary Physician  Dr. Kateri Plummer Primary Cardiologist  Irving Shows Reason for Consultation  Arrhythmia perioperatively  HPI: 57 y/o with no prior cardiac history.  She had hip surgery today.  During the surgery, she had a short run of an arrhythmia.  It was not recorded but reported as bigeminy, VTach and some bradycardia.  She was hemodynamically stable throughout.  She had no SHOB or CP  Or palpitations prior to surgery.  Currently she feels well.  No CP or SHOB.  No palpitations.  No arrhythmia on tele.     PMH:   Past Medical History  Diagnosis Date  . Hip discomfort     left  . Hypertension   . 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 Hypercholesterolemia   . Sleep apnea     USES CPAP   . History of shingles ? 2010    NO RESIDUAL PROBLEMS FROM SHINGLES  . Thyroid nodule     RIGHT--UNDER OBSERVATION-YEARLY BLOOD WORK-NO MEDICATION     PSH:   Past Surgical History  Procedure Laterality Date  . Breast surgery  1974    BREAST BIOPSY-BENIGN  . Tubal ligation  1985  . Dilation and curettage of uterus  09/21/11    D&C AND HYSTEROSCOPY FOR POSTMENOPAUSAL BLEEDING    Allergies:  Citric acid and Sulfa antibiotics Prior to Admit Meds:   Prescriptions prior to admission  Medication Sig Dispense Refill  . lisinopril-hydrochlorothiazide (PRINZIDE,ZESTORETIC) 10-12.5 MG per tablet Take 1 tablet by mouth every morning.       Marland Kitchen omeprazole (PRILOSEC) 20 MG capsule Take 20 mg by mouth every morning.      Marland Kitchen OVER THE COUNTER MEDICATION OTC PAMPARIN AT BEDTIME IF NEEDED FOR SLEEP      . simvastatin (ZOCOR) 20 MG tablet Take 20 mg by mouth every morning.      . diclofenac (VOLTAREN) 75 MG EC tablet Take 150 mg by mouth every morning.       . traMADol (ULTRAM) 50 MG tablet Take 100 mg by mouth at bedtime.       Fam HX:   History reviewed. No pertinent family history. Social HX:    History   Social History  .  Marital Status: Married    Spouse Name: N/A    Number of Children: N/A  . Years of Education: N/A   Occupational History  . Not on file.   Social History Main Topics  . Smoking status: Never Smoker   . Smokeless tobacco: Never Used  . Alcohol Use: No  . Drug Use: No  . Sexually Active: Not on file   Other Topics Concern  . Not on file   Social History Narrative  . No narrative on file     ROS:  All 11 ROS were addressed and are negative except what is stated in the HPI  Physical Exam: Blood pressure 137/78, pulse 78, temperature 98.1 F (36.7 C), temperature source Oral, resp. rate 20, height 5\' 2"  (1.575 m), weight 78.019 kg (172 lb), SpO2 98.00%.   General: Well developed, well nourished, in no acute distress Head:  Normal cephalic and atramatic  Lungs:   Clear bilaterally to auscultation and percussion. Heart: HRRR S1 S2 Pulses are 2+ & equal.            No carotid bruit. No JVD.   Abdomen:  abdomen soft and non-tender Extremities: No edema, 2+ PT pulses  bilaterally  Labs:   Lab Results  Component Value Date   WBC 7.1 08/01/2012   HGB 14.2 08/01/2012   HCT 41.9 08/01/2012   MCV 89.7 08/01/2012   PLT 327 08/01/2012   No results found for this basename: NA, K, CL, CO2, BUN, CREATININE, CALCIUM, LABALBU, PROT, BILITOT, ALKPHOS, ALT, AST, GLUCOSE,  in the last 168 hours No results found for this basename: PTT   Lab Results  Component Value Date   INR 0.92 08/01/2012   Lab Results  Component Value Date   CKTOTAL 279* 08/08/2012   CKMB 4.2* 08/08/2012   TROPONINI <0.30 08/08/2012     No results found for this basename: CHOL   No results found for this basename: HDL   No results found for this basename: LDLCALC   No results found for this basename: TRIG   No results found for this basename: CHOLHDL   No results found for this basename: LDLDIRECT      Radiology:  Dg Pelvis Portable  08/08/2012  *RADIOLOGY REPORT*  Clinical Data: Postop  PORTABLE PELVIS   Comparison: None.  Findings: Left total hip arthroplasty is in place. Anatomic alignment of the osseous and prosthetic structures.  Surgical drain projects over the operative site.  No breakage or loosening of the hardware.  Degenerative changes in the right hip joint are noted.  IMPRESSION: Left total arthroplasty anatomically aligned.   Original Report Authenticated By: Jolaine Click, M.D.     EKG:  pending  ASSESSMENT: Question of arrhythmia.   PLAN:  No signs of cardiac disease.  R/O for MI with enzymes.  Watch on telemetry.  Echo to evaluate for structural heart disease.    No PVD on prior studies.  I suspect that the brief arrhythmia she had was related to the stress of the surgery.  If above tests are normal, no further testing needed.  If arrhythmia noted on tele, we can set up for outpatient monitoring.    Corky Crafts., MD  08/08/2012  9:14 PM

## 2012-08-08 NOTE — H&P (View-Only) (Signed)
Mary Munoz  DOB: 08/16/1955 Married / Language: English / Race: White Female  Date of Admission:  08/08/2012  Chief Complaint:  Left Hip Pain  History of Present Illness The patient is a 57 year old female who comes in  for a preoperative History and Physical. The patient is scheduled for a left total hip arthroplasty (Anterior Approach) to be performed by Dr. Frank V. Aluisio, MD at [facility] on 08/08/2012. The patient is a 57 year old female who presents for follow up of their hip. The patient is being followed for their left hip pain and osteoarthritis. They are 7 week(s) out from intra-articular injection. Symptoms reported today include: pain, popping and grinding. The patient feels that they are doing poorly. Current treatment includes: NSAIDs (diclofenac). The following medication has been used for pain control: Ultram. The patient has not gotten any relief of their symptoms with Cortisone injections. The patient states that the hip is hurting her at all times now. It is limiting what she can and cannot do. She would like to exercise and be more active but cannot do so because of her hip. She will get pain at night at times. She is having a harder time sleeping. She is having a harder time getting around even doing activities of daily living. The intraarticular injection provided minimal benefit. I initially saw the patient a few years ago and she had joint space narrowing at that time. She was seen recently by Drew L. Perkins, PA-C and is now bone-on-bone. She is ready to proceed with surgery. They have been treated conservatively in the past for the above stated problem and despite conservative measures, they continue to have progressive pain and severe functional limitations and dysfunction. They have failed non-operative management including home exercise, medications, and injections. It is felt that they would benefit from undergoing total joint replacement. Risks  and benefits of the procedure have been discussed with the patient and they elect to proceed with surgery. There are no active contraindications to surgery such as ongoing infection or rapidly progressive neurological disease.   Problem List Osteoarthritis, Hip (715.35)   Allergies Sulfanilamide *CHEMICALS* Citric Acid *CHEMICALS*   Family History Chronic Obstructive Lung Disease. father Cerebrovascular Accident. father Heart Disease. father Diabetes Mellitus. mother Cancer. sister, grandmother fathers side and grandfather fathers side Osteoarthritis. mother Hypertension. mother, father and sister Osteoporosis. mother   Social History Living situation. live with spouse Illicit drug use. no Most recent primary occupation. clerk Marital status. married Current work status. working full time Children. 1 Exercise. Exercises rarely; does running / walking Drug/Alcohol Rehab (Currently). no Tobacco use. never smoker Pain Contract. no Number of flights of stairs before winded. 4-5 Tobacco / smoke exposure. no Previously in rehab. no Alcohol use. never consumed alcohol   Medication History Simvastatin (20MG Tablet, Oral) Active. Diclofenac Sodium (75MG Tablet DR, Oral) Active. TraMADol HCl (50MG Tablet, Oral) Active. Omeprazole (20MG Tablet DR, Oral) Active. Triamcinolone Acetonide (0.1% Cream, External) Active. Estradiol (1MG Tablet, Oral) Active. Prometrium (100MG Capsule, Oral) Active. Lisinopril-Hydrochlorothiazide (10-12.5MG Tablet, Oral) Active.   Past Surgical History Breast Biopsy. Date: 1974. Tubal Ligation. Date: 1985. Dilation and Curettage of Uterus. Date: 09/2011.   Medical History Hypercholesterolemia High blood pressure Sleep Apnea. uses CPAP Chronic Pain Shingles Urinary Tract Infection Measles Mumps Menopause   Review of Systems General:Not Present- Chills, Fever, Night Sweats, Fatigue, Weight Gain, Weight  Loss and Memory Loss. Skin:Not Present- Hives, Itching, Rash, Eczema and Lesions. HEENT:Not Present- Tinnitus, Headache, Double Vision,   Visual Loss, Hearing Loss and Dentures. Respiratory:Not Present- Shortness of breath with exertion, Shortness of breath at rest, Allergies, Coughing up blood and Chronic Cough. Cardiovascular:Not Present- Chest Pain, Racing/skipping heartbeats, Difficulty Breathing Lying Down, Murmur, Swelling and Palpitations. Gastrointestinal:Not Present- Bloody Stool, Heartburn, Abdominal Pain, Vomiting, Nausea, Constipation, Diarrhea, Difficulty Swallowing, Jaundice and Loss of appetitie. Female Genitourinary:Not Present- Blood in Urine, Urinary frequency, Weak urinary stream, Discharge, Flank Pain, Incontinence, Painful Urination, Urgency, Urinary Retention and Urinating at Night. Musculoskeletal:Present- Joint Pain. Not Present- Muscle Weakness, Muscle Pain, Joint Swelling, Back Pain, Morning Stiffness and Spasms. Neurological:Not Present- Tremor, Dizziness, Blackout spells, Paralysis, Difficulty with balance and Weakness. Psychiatric:Not Present- Insomnia.   Vitals Pulse: 84 (Regular) Resp.: 16 (Unlabored) BP: 138/86 (Sitting, Right Arm, Standard)    Physical Exam The physical exam findings are as follows:  Note: Patient is a 57 year old female with continued hip pain.   General Mental Status - Alert, cooperative and good historian. General Appearance- pleasant. Not in acute distress. Orientation- Oriented X3. Build & Nutrition- Well nourished and Well developed.   Head and Neck Head- normocephalic, atraumatic . Neck Global Assessment- supple. no bruit auscultated on the right and no bruit auscultated on the left.   Eye Vision- Wears contact lenses. Pupil- Bilateral- Regular and Round. Motion- Bilateral- EOMI.   Chest and Lung Exam Auscultation: Breath sounds:- clear at anterior chest wall and - clear at posterior  chest wall. Adventitious sounds:- No Adventitious sounds.   Cardiovascular Auscultation:Rhythm- Regular rate and rhythm. Heart Sounds- S1 WNL and S2 WNL. Murmurs & Other Heart Sounds:Auscultation of the heart reveals - No Murmurs.   Abdomen Palpation/Percussion:Tenderness- Abdomen is non-tender to palpation. Rigidity (guarding)- Abdomen is soft. Auscultation:Auscultation of the abdomen reveals - Bowel sounds normal.   Female Genitourinary Not done, not pertinent to present illness  Musculoskeletal Patient is a 57 YO white female, well nourished, well developed, in no acute distress. She is slow getting off and on the examination table. Back is examined. She's nontender about the cervical, thoracic and lumbosacral area, nontender over the perilumbar muscles. She has good trunk motion with no significant pain. She is able to stand heel and toe and maintain position. In the right hip, she has normal, pain-free passive motion with flexion up over 120 degrees, internal rotation 25, external rotation 30, abduction 35. The left hip has significant decreased ROM with only flexion of 100 degrees. She has 0 internal rotation, about 25 degrees of external rotation, about 20 degrees of abduction. She does have pain noted on attempted internal rotation. She is neurovascularly intact. Sensation is intact in both lower extremities. Pulses are noted to be intact.   RADIOGRAPHS: AP pelvis of the hip shows well-maintained joint space of the right hip, but on the left hip she has significant complete loss of the femoral acetabular joint space throughout with flattening of the femoral head. She has cystic changes in the acetabulum, also the femoral head. This is also confirmed on the lateral view with significant cystic changes in the femoral head area of the acetabulum with complete loss of the joint space and large femoral head spurring. She has dysplastic changes of the acetabulum and  her femoral head is riding above the normal center of rotation. She is about one-half inch short on the left. 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  Assessment & Plan Osteoarthritis, Hip (715.35) Impression: Left Hip  Note: Plan is for a Left Total Hip Replacement - Anterior Approach by Dr. Aluisio.    Plan is to go to [facility] following the surgery.  PCP - Dr. Aaron Morrow - [facility]  Signed electronically by DREW L PERKINS, PA-C 

## 2012-08-08 NOTE — Progress Notes (Signed)
O.K. To go to Telemetry- per Dr. Leta Jungling- Cardiologist can see patient on floor.

## 2012-08-08 NOTE — Progress Notes (Signed)
Dr. Lequita Halt talked with patient.

## 2012-08-09 ENCOUNTER — Encounter (HOSPITAL_COMMUNITY): Payer: Self-pay | Admitting: Orthopedic Surgery

## 2012-08-09 LAB — CBC
HCT: 30.8 % — ABNORMAL LOW (ref 36.0–46.0)
Hemoglobin: 10.6 g/dL — ABNORMAL LOW (ref 12.0–15.0)
MCH: 30.6 pg (ref 26.0–34.0)
MCHC: 34.4 g/dL (ref 30.0–36.0)
MCV: 89 fL (ref 78.0–100.0)
RBC: 3.46 MIL/uL — ABNORMAL LOW (ref 3.87–5.11)

## 2012-08-09 LAB — BASIC METABOLIC PANEL
BUN: 11 mg/dL (ref 6–23)
CO2: 28 mEq/L (ref 19–32)
Calcium: 8.9 mg/dL (ref 8.4–10.5)
GFR calc non Af Amer: >90 mL/min (ref >90)
Glucose, Bld: 143 mg/dL — ABNORMAL HIGH (ref 70–99)
Sodium: 136 mEq/L (ref 135–145)

## 2012-08-09 MED ORDER — NON FORMULARY: 20.0000 mg | Freq: Every morning | Status: DC

## 2012-08-09 MED ORDER — OMEPRAZOLE 20 MG PO CPDR
20.0000 mg | DELAYED_RELEASE_CAPSULE | Freq: Every day | ORAL | Status: DC
  Administered 2012-08-09 – 2012-08-10 (×2): 20 mg via ORAL
  Filled 2012-08-09 (×2): qty 1

## 2012-08-09 NOTE — Progress Notes (Signed)
Utilization review completed.  

## 2012-08-09 NOTE — Progress Notes (Signed)
Subjective:  No complaints, no SOB, no CP, no palps. No syncope.   Objective:  Vital Signs in the last 24 hours: Temp:  [97.8 F (36.6 C)-98.5 F (36.9 C)] 97.8 F (36.6 C) (04/03 0523) Pulse Rate:  [72-115] 84 (04/03 0523) Resp:  [9-20] 16 (04/03 1114) BP: (122-141)/(65-83) 140/82 mmHg (04/03 0523) SpO2:  [98 %-100 %] 98 % (04/03 1114) Weight:  [78.019 kg (172 lb)] 78.019 kg (172 lb) (04/02 1845)  Intake/Output from previous day: 04/02 0701 - 04/03 0700 In: 2985.3 [I.V.:2885.3; IV Piggyback:100] Out: 1805 [Urine:1025; Drains:280; Blood:500]   Physical Exam: General: Well developed, well nourished, in no acute distress. Head:  Normocephalic and atraumatic. Lungs: Clear to auscultation and percussion. Heart: Normal S1 and S2. Occasional ectopy.  No murmur, rubs or gallops.  Abdomen: soft, non-tender, positive bowel sounds. Extremities: No clubbing or cyanosis. No edema. Neurologic: Alert and oriented x 3.    Lab Results:  Recent Labs  08/09/12 0420  WBC 13.3*  HGB 10.6*  PLT 308    Recent Labs  08/09/12 0420  NA 136  K 3.9  CL 101  CO2 28  GLUCOSE 143*  BUN 11  CREATININE 0.75    Recent Labs  08/08/12 1703  TROPONINI <0.30    Telemetry: NSR/ sinus tachy with frequent PVC's sometimes every fourth or fifth beat. No VT.  Personally viewed.   EKG:  Borderline LBBB. SR.   Cardiac Studies:  ECHO - normal EF  Assessment/Plan:  Principal Problem:   OA (osteoarthritis) of hip  57 year old with operative arrhythmia (possible bigeminy, NSVT).    - Troponin reassuring.   - ECHO reassuring  - Tele with PVC's noted. In the setting of structurally normal heart with no high risk symptoms such as syncope or angina no further treatment is needed.  - No need for outpatient event monitor.   Will sign off. Please call with questions. Discussed with patient.  OK from cardiac standpoint for SNF.   Kuuipo Anzaldo 08/09/2012, 2:00 PM

## 2012-08-09 NOTE — Care Management Note (Addendum)
    Page 1 of 1   08/10/2012     11:08:03 AM   CARE MANAGEMENT NOTE 08/10/2012  Patient:  Mary Munoz   Account Number:  [id number]  Date Initiated:  08/09/2012  Documentation initiated by:  Lanier Clam  Subjective/Objective Assessment:   ADMITTED W/OA L HIP.     Action/Plan:   FROM HOME.HAS PCP,PHARMACY.   Anticipated DC Date:  08/10/2012   Anticipated DC Plan:  SKILLED NURSING FACILITY      DC Planning Services  CM consult      Choice offered to / List presented to:             Status of service:  Completed, signed off Medicare Important Message given?   (If response is "NO", the following Medicare IM given date fields will be blank) Date Medicare IM given:   Date Additional Medicare IM given:    Discharge Disposition:  SKILLED NURSING FACILITY  Per UR Regulation:  Reviewed for med. necessity/level of care/duration of stay  If discussed at Long Length of Stay Meetings, dates discussed:    Comments:  08/10/12 Shanekqua Schaper RN,BSN NCM [phone] D/C SNF. 08/09/12 Rielynn Trulson RN,BSN NCM [phone] POD#1 L THA.PT-SNF.D/C PLAN SNF.

## 2012-08-09 NOTE — Progress Notes (Signed)
  Echocardiogram 2D Echocardiogram has been performed.  Mary Munoz 08/09/2012, 8:50 AM 

## 2012-08-09 NOTE — Progress Notes (Signed)
Clinical Social Work Department BRIEF PSYCHOSOCIAL ASSESSMENT 08/09/2012  Patient:  Mary Munoz, Mary Munoz     Account Number:  [id number]     Admit date:  08/08/2012  Clinical Social Worker:  Hattie Perch  Date/Time:  08/09/2012 12:00 M  Referred by:  Physician  Date Referred:  08/09/2012 Referred for  SNF Placement   Other Referral:   Interview type:  Patient Other interview type:    PSYCHOSOCIAL DATA Living Status:  FAMILY Admitted from facility:   Level of care:   Primary support name:  Rendy Lazard Primary support relationship to patient:  SPOUSE Degree of support available:   good    CURRENT CONCERNS Current Concerns  Post-Acute Placement   Other Concerns:    SOCIAL WORK ASSESSMENT / PLAN CSW met with patient. Patient is alert and oriented X3. patient in need of snf placement. patient is preregistered at [facility] for snf upon discharge.   Assessment/plan status:   Other assessment/ plan:   Information/referral to community resources:    PATIENT'S/FAMILY'S RESPONSE TO PLAN OF CARE: agreeable to [facility] upon discharge.

## 2012-08-09 NOTE — Progress Notes (Signed)
Patient was complaining of tenderness and increased pressure in her bladder. States she has had the urge to urinate for the past 30 minutes. RN performed a bladder scan and got . RN repositioned catheter tubing and got about out of the foley.

## 2012-08-09 NOTE — Progress Notes (Signed)
Physical Therapy Treatment Note   08/09/12 1600  PT Visit Information  Last PT Received On 08/09/12  Assistance Needed +1  PT Time Calculation  PT Start Time 1440  PT Stop Time 1505  PT Time Calculation (min) 25 min  Subjective Data  Subjective I'm feeling much better than this morning.  Precautions  Precautions None  Restrictions  LLE Weight Bearing WBAT  Cognition  Overall Cognitive Status Appears within functional limits for tasks assessed/performed  Arousal/Alertness Awake/alert  Transfers  Transfers Sit to Stand;Stand to Sit  Sit to Stand 4: Min guard;With upper extremity assist;From chair/3-in-1;From toilet  Stand to Sit 4: Min guard;With upper extremity assist;To chair/3-in-1;To toilet  Details for Transfer Assistance verbal cues for safe technique  Ambulation/Gait  Ambulation/Gait Assistance 4: Min guard  Ambulation Distance (Feet) 100 Feet  Assistive device Rolling walker  Ambulation/Gait Assistance Details verbal cues for sequence, step length, use of RW, pt continued with step to pattern as step through more painful  Gait Pattern Step-to pattern;Decreased step length - right  Gait velocity decreased  General Gait Details no dizziness this session  Exercises  Exercises Total Joint  Total Joint Exercises  Ankle Circles/Pumps AROM;Both;20 reps  Quad Sets AROM;Both;20 reps  Gluteal Sets AROM;Both;20 reps  Heel Slides AROM;Left;10 reps  Hip ABduction/ADduction AAROM;15 reps;Left  Long Arc Quad AROM;15 reps;Left  PT - End of Session  Activity Tolerance Patient tolerated treatment well  Patient left in chair;with call bell/phone within reach  Nurse Communication Mobility status;Patient requests pain meds  PT - Assessment/Plan  Comments on Treatment Session Pt reports pain "tolerable" after tx session.   Pt ambulated in hallway short distance and then performed exercises in recliner.  PT Plan Discharge plan remains appropriate;Frequency remains appropriate  Follow  Up Recommendations SNF  PT equipment Rolling walker with 5" wheels  Acute Rehab PT Goals  PT Goal: Sit to Stand - Progress Progressing toward goal  PT Goal: Stand to Sit - Progress Progressing toward goal  PT Goal: Ambulate - Progress Progressing toward goal  PT Goal: Perform Home Exercise Program - Progress Progressing toward goal  PT General Charges  $$ ACUTE PT VISIT 1 Procedure  PT Treatments  $Gait Training 8-22 mins  $Therapeutic Exercise 8-22 mins   Zenovia Jarred, PT, DPT 08/09/2012 Pager: [phone]

## 2012-08-09 NOTE — Progress Notes (Signed)
   Subjective: 1 Day Post-Op Procedure(s) (LRB): TOTAL HIP ARTHROPLASTY ANTERIOR APPROACH (Left) Patient reports pain as mild.   Patient seen in rounds with Dr. Lequita Halt. Family in room. She denies any other complaints except for some soreness with the hip. Patient is well, but has had some minor complaints of pain in the hip, requiring pain medications We will start therapy today.  Plan is to go Skilled nursing facility after hospital stay.  Seen in consultation by Dr. Eldridge Dace yesterday following surgery. ASSESSMENT: Question of arrhythmia.  PLAN: No signs of cardiac disease. R/O for MI with enzymes. Watch on telemetry. Echo to evaluate for structural heart disease.  No PVD on prior studies. I suspect that the brief arrhythmia she had was related to the stress of the surgery. If above tests are normal, no further testing needed. If arrhythmia noted on tele, we can set up for outpatient monitoring.   ECHO pending.  Objective: Vital signs in last 24 hours: Temp:  [97.8 F (36.6 C)-98.5 F (36.9 C)] 97.8 F (36.6 C) (04/03 0523) Pulse Rate:  [72-115] 84 (04/03 0523) Resp:  [9-20] 16 (04/03 0800) BP: (122-153)/(65-86) 140/82 mmHg (04/03 0523) SpO2:  [95 %-100 %] 98 % (04/03 0800) Weight:  [78.019 kg (172 lb)] 78.019 kg (172 lb) (04/02 1845)  Intake/Output from previous day:  Intake/Output Summary (Last 24 hours) at 08/09/12 0943 Last data filed at 08/09/12 0911  Gross per 24 hour  Intake 2985.25 ml  Output   2005 ml  Net 980.25 ml    Intake/Output this shift: Total I/O In: -  Out: 200 [Urine:200]  Labs:  Recent Labs  08/09/12 0420  HGB 10.6*    Recent Labs  08/09/12 0420  WBC 13.3*  RBC 3.46*  HCT 30.8*  PLT 308    Recent Labs  08/09/12 0420  NA 136  K 3.9  CL 101  CO2 28  BUN 11  CREATININE 0.75  GLUCOSE 143*  CALCIUM 8.9   No results found for this basename: LABPT, INR,  in the last 72 hours  EXAM General - Patient is Alert, Appropriate and  Oriented Extremity - Neurovascular intact Sensation intact distally Dorsiflexion/Plantar flexion intact Dressing - dressing C/D/I Motor Function - intact, moving foot and toes well on exam.  Hemovac pulled without difficulty.  Past Medical History  Diagnosis Date  . Hip discomfort     left  . Hypertension   . 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 Hypercholesterolemia   . Sleep apnea     USES CPAP   . History of shingles ? 2010    NO RESIDUAL PROBLEMS FROM SHINGLES  . Thyroid nodule     RIGHT--UNDER OBSERVATION-YEARLY BLOOD WORK-NO MEDICATION  . Cardiac dysrhythmia, unspecified     Assessment/Plan: 1 Day Post-Op Procedure(s) (LRB): TOTAL HIP ARTHROPLASTY ANTERIOR APPROACH (Left) Principal Problem:   OA (osteoarthritis) of hip  Estimated body mass index is 31.45 kg/(m^2) as calculated from the following:   Height as of this encounter: 5\' 2"  (1.575 m).   Weight as of this encounter: 78.019 kg (172 lb). Up with therapy Discharge to SNF  When stable and bed available.  DVT Prophylaxis - Xarelto Weight Bearing As Tolerated left Leg Hemovac Pulled Begin Therapy No vaccines.  Patrica Duel 08/09/2012, 9:43 AM

## 2012-08-09 NOTE — Evaluation (Signed)
Physical Therapy Evaluation Patient Details Name: Mary Munoz MRN: [id number] DOB: 02/08/1956 Today's Date: 08/09/2012 Time: 4098-1191 PT Time Calculation (min): 15 min  PT Assessment / Plan / Recommendation Clinical Impression  Pt s/p L direct anterior THR.  Pt would benefit from acute PT services in order to improve independence with transfers and ambulation to prepare for d/c to next venue.  Pt plans to d/c to SNF.    PT Assessment  Patient needs continued PT services    Follow Up Recommendations  SNF    Does the patient have the potential to tolerate intense rehabilitation      Barriers to Discharge        Equipment Recommendations  Rolling walker with 5" wheels    Recommendations for Other Services     Frequency 7X/week    Precautions / Restrictions Precautions Precautions: None Restrictions Weight Bearing Restrictions: Yes LLE Weight Bearing: Weight bearing as tolerated   Pertinent Vitals/Pain Pt reports "tingling" pain in L hip.  Premedicated earlier per RN.  Repositioned in recliner.      Mobility  Bed Mobility Bed Mobility: Supine to Sit Supine to Sit: 4: Min assist Details for Bed Mobility Assistance: assist for L LE over EOB, verbal cues for technique Transfers Transfers: Sit to Stand;Stand to Sit Sit to Stand: 4: Min guard;With upper extremity assist;From bed Stand to Sit: 4: Min guard;With upper extremity assist;To chair/3-in-1 Details for Transfer Assistance: verbal cues for safe technique Ambulation/Gait Ambulation/Gait Assistance: 4: Min guard Ambulation Distance (Feet): 25 Feet Assistive device: Rolling walker Ambulation/Gait Assistance Details: verbal cues for sequence, step length, use of RW Gait Pattern: Step-to pattern;Decreased step length - right Gait velocity: decreased General Gait Details: dizziness limiting distance    Exercises     PT Diagnosis: Difficulty walking  PT Problem List: Decreased strength;Decreased  mobility;Decreased knowledge of use of DME;Pain PT Treatment Interventions: DME instruction;Gait training;Functional mobility training;Stair training;Therapeutic activities;Therapeutic exercise;Patient/family education   PT Goals Acute Rehab PT Goals PT Goal Formulation: With patient Time For Goal Achievement: 08/16/12 Potential to Achieve Goals: Good Pt will go Supine/Side to Sit: with modified independence PT Goal: Supine/Side to Sit - Progress: Goal set today Pt will go Sit to Supine/Side: with modified independence Pt will go Sit to Stand: with modified independence PT Goal: Sit to Stand - Progress: Goal set today Pt will go Stand to Sit: with modified independence PT Goal: Stand to Sit - Progress: Goal set today Pt will Ambulate: 51 - 150 feet;with modified independence;with least restrictive assistive device PT Goal: Ambulate - Progress: Goal set today Pt will Perform Home Exercise Program: with supervision, verbal cues required/provided PT Goal: Perform Home Exercise Program - Progress: Goal set today  Visit Information  Last PT Received On: 08/09/12 Assistance Needed: +1    Subjective Data  Subjective: That wasn't as bad as I thought.  (OOB first time after sx)   Prior Functioning  Home Living Lives With: Spouse Type of Home: House Home Access: Stairs to enter Home Layout: One level Home Adaptive Equipment: Straight cane Prior Function Level of Independence: Independent with assistive device(s) Communication Communication: No difficulties    Cognition  Cognition Overall Cognitive Status: Appears within functional limits for tasks assessed/performed Arousal/Alertness: Awake/alert Orientation Level: Appears intact for tasks assessed Behavior During Session: [facility] for tasks performed    Extremity/Trunk Assessment Right Upper Extremity Assessment RUE ROM/Strength/Tone: [facility] for tasks assessed Left Upper Extremity Assessment LUE ROM/Strength/Tone: [facility] for tasks  assessed Right Lower Extremity Assessment RLE  ROM/Strength/Tone: [facility] for tasks assessed Left Lower Extremity Assessment LLE ROM/Strength/Tone: Deficits LLE ROM/Strength/Tone Deficits: assist required for bed mobility, functional hip weakness observed   Balance    End of Session PT - End of Session Activity Tolerance: Patient tolerated treatment Munoz Patient left: in chair;with call bell/phone within reach Nurse Communication: Mobility status  GP     Mary Munoz,Mary Munoz 08/09/2012, 9:34 AM Zenovia Jarred, PT, DPT 08/09/2012 Pager: [phone]

## 2012-08-10 ENCOUNTER — Other Ambulatory Visit: Payer: Self-pay | Admitting: *Deleted

## 2012-08-10 DIAGNOSIS — E876 Hypokalemia: Secondary | ICD-10-CM

## 2012-08-10 DIAGNOSIS — D62 Acute posthemorrhagic anemia: Secondary | ICD-10-CM

## 2012-08-10 LAB — BASIC METABOLIC PANEL
CO2: 27 mEq/L (ref 19–32)
Calcium: 8.8 mg/dL (ref 8.4–10.5)
Creatinine, Ser: 0.75 mg/dL (ref 0.50–1.10)
GFR calc non Af Amer: >90 mL/min (ref >90)
Glucose, Bld: 134 mg/dL — ABNORMAL HIGH (ref 70–99)
Sodium: 140 mEq/L (ref 135–145)

## 2012-08-10 LAB — CBC
MCH: 30.9 pg (ref 26.0–34.0)
MCHC: 34.4 g/dL (ref 30.0–36.0)
MCV: 89.8 fL (ref 78.0–100.0)
Platelets: 290 10*3/uL (ref 150–400)
RBC: 3.33 MIL/uL — ABNORMAL LOW (ref 3.87–5.11)

## 2012-08-10 MED ORDER — ONDANSETRON HCL 4 MG PO TABS: 4.0000 mg | ORAL_TABLET | Freq: Four times a day (QID) | ORAL | Status: DC | PRN

## 2012-08-10 MED ORDER — METOCLOPRAMIDE HCL 5 MG PO TABS: 5.0000 mg | ORAL_TABLET | Freq: Three times a day (TID) | ORAL | Status: DC | PRN

## 2012-08-10 MED ORDER — METHOCARBAMOL 500 MG PO TABS: 500.0000 mg | ORAL_TABLET | Freq: Four times a day (QID) | ORAL | Status: DC | PRN

## 2012-08-10 MED ORDER — ACETAMINOPHEN 325 MG PO TABS: 650.0000 mg | ORAL_TABLET | Freq: Four times a day (QID) | ORAL | Status: DC | PRN

## 2012-08-10 MED ORDER — DSS 100 MG PO CAPS: 100.0000 mg | ORAL_CAPSULE | Freq: Two times a day (BID) | ORAL | Status: DC

## 2012-08-10 MED ORDER — DIPHENHYDRAMINE HCL 12.5 MG/5ML PO ELIX: 12.5000 mg | ORAL_SOLUTION | ORAL | Status: DC | PRN

## 2012-08-10 MED ORDER — RIVAROXABAN 10 MG PO TABS: 10.0000 mg | ORAL_TABLET | Freq: Every day | ORAL | Status: DC

## 2012-08-10 MED ORDER — OXYCODONE HCL 5 MG PO TABS: 5.0000 mg | ORAL_TABLET | ORAL | Status: DC | PRN

## 2012-08-10 MED ORDER — POLYETHYLENE GLYCOL 3350 17 G PO PACK: 17.0000 g | PACK | Freq: Every day | ORAL | Status: DC | PRN

## 2012-08-10 MED ORDER — POTASSIUM CHLORIDE CRYS ER 20 MEQ PO TBCR
40.0000 meq | EXTENDED_RELEASE_TABLET | ORAL | Status: AC
  Administered 2012-08-10 (×2): 40 meq via ORAL
  Filled 2012-08-10 (×2): qty 2

## 2012-08-10 MED ORDER — BISACODYL 10 MG RE SUPP: 10.0000 mg | Freq: Every day | RECTAL | Status: DC | PRN

## 2012-08-10 NOTE — Progress Notes (Signed)
Report called to [place] place to [facility]. Patient to leave via EMS shortly. Erskin Burnet RN

## 2012-08-10 NOTE — Evaluation (Signed)
Occupational Therapy Evaluation Patient Details Name: Mary Munoz MRN: [id number] DOB: 1956/03/30 Today's Date: 08/10/2012 Time: 1308-6578    OT Assessment / Plan / Recommendation Clinical Impression  Pt presents to OT s/p THA. Pt will benefit from further OT as SNF to increase I with ADL activity and return to PLOF    OT Assessment  All further OT needs can be met in the next venue of care    Follow Up Recommendations  SNF                Precautions / Restrictions Precautions Precautions: None Restrictions LLE Weight Bearing: Weight bearing as tolerated       ADL  Lower Body Dressing: Performed;Moderate assistance Where Assessed - Lower Body Dressing: Unsupported sit to stand Toilet Transfer: Performed;Minimal assistance Toilet Transfer Method: Sit to stand Toilet Transfer Equipment: Raised toilet seat with arms (or 3-in-1 over toilet) Toileting - Clothing Manipulation and Hygiene: Performed;Minimal assistance Where Assessed - Toileting Clothing Manipulation and Hygiene: Standing Transfers/Ambulation Related to ADLs: Pt needed increased time to perform task.      OT Diagnosis: Generalized weakness;Acute pain  OT Problem List: Decreased strength;Pain       Visit Information  Assistance Needed: +1       Prior Functioning     Home Living Lives With: Spouse Type of Home: House Home Access: Stairs to enter Home Layout: One level Bathroom Shower/Tub: Teacher, adult education: Straight cane Prior Function Level of Independence: Independent with assistive device(s) Communication Communication: No difficulties         Vision/Perception Vision - History Patient Visual Report: No change from baseline   Cognition  Cognition Overall Cognitive Status: Appears within functional limits for tasks assessed/performed Arousal/Alertness: Awake/alert       Mobility Bed Mobility Bed Mobility: Supine to Sit Supine to Sit: 5:  Supervision Details for Bed Mobility Assistance: verbal cues for technique Transfers Transfers: Sit to Stand;Stand to Sit Sit to Stand: 4: Min assist;With upper extremity assist;From chair/3-in-1;From toilet Stand to Sit: 4: Min assist;With upper extremity assist;To toilet;To chair/3-in-1 Details for Transfer Assistance: verbal cues for safe technique     Exercise Total Joint Exercises Ankle Circles/Pumps: AROM;Both;20 reps Quad Sets: AROM;Both;20 reps Gluteal Sets: AROM;Both;20 reps Short Arc Quad: AROM;Left;15 reps Heel Slides: AROM;Left;10 reps Hip ABduction/ADduction: AAROM;15 reps;Left Straight Leg Raises: AAROM;Left;10 reps Long Arc Quad: AROM;15 reps;Left      End of Session OT - End of Session Activity Tolerance: Patient tolerated treatment well Patient left: in chair  GO     Estephan Gallardo, [place] Kung 08/10/2012, 11:30 AM

## 2012-08-10 NOTE — Progress Notes (Signed)
Physical Therapy Treatment Patient Details Name: Mary Munoz MRN: [id number] DOB: 1956/03/07 Today's Date: 08/10/2012 Time: 9147-8295 PT Time Calculation (min): 24 min  PT Assessment / Plan / Recommendation Comments on Treatment Session  Pt doing great with mobility and exercises.  Plans to d/c to [facility] for rehab possibly today.    Follow Up Recommendations  SNF     Does the patient have the potential to tolerate intense rehabilitation     Barriers to Discharge        Equipment Recommendations  Rolling walker with 5" wheels    Recommendations for Other Services    Frequency     Plan Discharge plan remains appropriate;Frequency remains appropriate    Precautions / Restrictions Precautions Precautions: None Restrictions LLE Weight Bearing: Weight bearing as tolerated   Pertinent Vitals/Pain n/a    Mobility  Bed Mobility Bed Mobility: Supine to Sit Supine to Sit: 5: Supervision Details for Bed Mobility Assistance: verbal cues for technique Transfers Transfers: Sit to Stand;Stand to Sit Sit to Stand: 4: Min guard;With upper extremity assist;From chair/3-in-1;From toilet Stand to Sit: 4: Min guard;With upper extremity assist;To chair/3-in-1;To toilet Details for Transfer Assistance: verbal cues for safe technique Ambulation/Gait Ambulation/Gait Assistance: 4: Min guard Ambulation Distance (Feet): 100 Feet Assistive device: Rolling walker Ambulation/Gait Assistance Details: pt using RW well, even able to tolerate a few steps near end of ambulation with step through pattern Gait Pattern: Step-to pattern;Step-through pattern Gait velocity: decreased    Exercises Total Joint Exercises Ankle Circles/Pumps: AROM;Both;20 reps Quad Sets: AROM;Both;20 reps Gluteal Sets: AROM;Both;20 reps Short Arc Quad: AROM;Left;15 reps Heel Slides: AROM;Left;10 reps Hip ABduction/ADduction: AAROM;15 reps;Left Straight Leg Raises: AAROM;Left;10 reps Long Arc Quad: AROM;15  reps;Left   PT Diagnosis:    PT Problem List:   PT Treatment Interventions:     PT Goals Acute Rehab PT Goals PT Goal: Supine/Side to Sit - Progress: Progressing toward goal PT Goal: Sit to Stand - Progress: Progressing toward goal PT Goal: Stand to Sit - Progress: Progressing toward goal PT Goal: Ambulate - Progress: Progressing toward goal PT Goal: Perform Home Exercise Program - Progress: Progressing toward goal  Visit Information  Last PT Received On: 08/10/12 Assistance Needed: +1    Subjective Data  Subjective: I'm ready to get out of this bed.   Cognition  Cognition Overall Cognitive Status: Appears within functional limits for tasks assessed/performed Arousal/Alertness: Awake/alert    Balance     End of Session PT - End of Session Activity Tolerance: Patient tolerated treatment well Patient left: in chair;with call bell/phone within reach   GP     Abdullahi Vallone,KATHrine E 08/10/2012, 11:22 AM Zenovia Jarred, PT, DPT 08/10/2012 Pager: [phone]

## 2012-08-10 NOTE — Discharge Summary (Signed)
Physician Discharge Summary   Patient ID: Mary Munoz MRN: [id number] DOB/AGE: 59-Oct-1957 59 y.o.  Admit date: 08/08/2012 Discharge date: Tentative Date of Discharge - 08/10/2012 Friday  Primary Diagnosis:  Osteoarthritis of the Left hip.   Admission Diagnoses:  Past Medical History  Diagnosis Date  . Hip discomfort     left  . Hypertension   . 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 Hypercholesterolemia   . Sleep apnea     USES CPAP   . History of shingles ? 2010    NO RESIDUAL PROBLEMS FROM SHINGLES  . Thyroid nodule     RIGHT--UNDER OBSERVATION-YEARLY BLOOD WORK-NO MEDICATION  . Cardiac dysrhythmia, unspecified    Discharge Diagnoses:   Principal Problem:   OA (osteoarthritis) of hip Active Problems:   Postoperative anemia due to acute blood loss   Postop Hypokalemia  Estimated body mass index is 31.45 kg/(m^2) as calculated from the following:   Height as of this encounter: 5\' 2"  (1.575 m).   Weight as of this encounter: 78.019 kg (172 lb).  Procedure(s) (LRB): TOTAL HIP ARTHROPLASTY ANTERIOR APPROACH (Left)   Consults: cardiology - Dr. Eldridge Dace  HPI: Mary Munoz is a 57 y.o. female who has advanced end-  stage arthritis of his Left hip with progressively worsening pain and  dysfunction.The patient has failed nonoperative management and presents for  total hip arthroplasty.   Laboratory Data: Admission on 08/08/2012  Component Date Value Range Status  . Total CK 08/08/2012 279* 7 - 177 U/L Final  . CK, MB 08/08/2012 4.2* 0.3 - 4.0 ng/mL Final  . Relative Index 08/08/2012 1.5  0.0 - 2.5 Final  . Troponin I 08/08/2012 <0.30  <0.30 ng/mL Final   Comment:                                 Due to the release kinetics of cTnI,                          a negative result within the first hours                          of the onset of symptoms does not rule out                          myocardial infarction with certainty.                     If myocardial infarction is still suspected,                          repeat the test at appropriate intervals.  . WBC 08/09/2012 13.3* 4.0 - 10.5 K/uL Final  . RBC 08/09/2012 3.46* 3.87 - 5.11 MIL/uL Final  . Hemoglobin 08/09/2012 10.6* 12.0 - 15.0 g/dL Final  . HCT 65/78/4696 30.8* 36.0 - 46.0 % Final  . MCV 08/09/2012 89.0  78.0 - 100.0 fL Final  . MCH 08/09/2012 30.6  26.0 - 34.0 pg Final  . MCHC 08/09/2012 34.4  30.0 - 36.0 g/dL Final  . RDW 29/52/8413 13.1  11.5 - 15.5 % Final  . Platelets 08/09/2012 308  150 - 400 K/uL Final  . Sodium 08/09/2012 136  135 - 145 mEq/L  Final  . Potassium 08/09/2012 3.9  3.5 - 5.1 mEq/L Final  . Chloride 08/09/2012 101  96 - 112 mEq/L Final  . CO2 08/09/2012 28  19 - 32 mEq/L Final  . Glucose, Bld 08/09/2012 143* 70 - 99 mg/dL Final  . BUN 16/02/9603 11  6 - 23 mg/dL Final  . Creatinine, Ser 08/09/2012 0.75  0.50 - 1.10 mg/dL Final  . Calcium 54/01/8118 8.9  8.4 - 10.5 mg/dL Final  . GFR calc non Af Amer 08/09/2012 >90  >90 mL/min Final  . GFR calc Af Amer 08/09/2012 >90  >90 mL/min Final   Comment:                                 The eGFR has been calculated                          using the CKD EPI equation.                          This calculation has not been                          validated in all clinical                          situations.                          eGFR's persistently                          <90 mL/min signify                          possible Chronic Kidney Disease.  . WBC 08/10/2012 16.4* 4.0 - 10.5 K/uL Final  . RBC 08/10/2012 3.33* 3.87 - 5.11 MIL/uL Final  . Hemoglobin 08/10/2012 10.3* 12.0 - 15.0 g/dL Final  . HCT 14/78/2956 29.9* 36.0 - 46.0 % Final  . MCV 08/10/2012 89.8  78.0 - 100.0 fL Final  . MCH 08/10/2012 30.9  26.0 - 34.0 pg Final  . MCHC 08/10/2012 34.4  30.0 - 36.0 g/dL Final  . RDW 21/30/8657 13.2  11.5 - 15.5 % Final  . Platelets 08/10/2012 290  150 - 400 K/uL Final  . Sodium  08/10/2012 140  135 - 145 mEq/L Final  . Potassium 08/10/2012 3.3* 3.5 - 5.1 mEq/L Final  . Chloride 08/10/2012 104  96 - 112 mEq/L Final  . CO2 08/10/2012 27  19 - 32 mEq/L Final  . Glucose, Bld 08/10/2012 134* 70 - 99 mg/dL Final  . BUN 84/69/6295 13  6 - 23 mg/dL Final  . Creatinine, Ser 08/10/2012 0.75  0.50 - 1.10 mg/dL Final  . Calcium 28/41/3244 8.8  8.4 - 10.5 mg/dL Final  . GFR calc non Af Amer 08/10/2012 >90  >90 mL/min Final  . GFR calc Af Amer 08/10/2012 >90  >90 mL/min Final   Comment:                                 The eGFR has been calculated  using the CKD EPI equation.                          This calculation has not been                          validated in all clinical                          situations.                          eGFR's persistently                          <90 mL/min signify                          possible Chronic Kidney Disease.  Hospital Outpatient Visit on 08/01/2012  Component Date Value Range Status  . MRSA, PCR 08/01/2012 NEGATIVE  NEGATIVE Final  . Staphylococcus aureus 08/01/2012 NEGATIVE  NEGATIVE Final   Comment:                                 The Xpert SA Assay (FDA                          approved for NASAL specimens                          in patients over 19 years of age),                          is one component of                          a comprehensive surveillance                          program.  Test performance has                          been validated by [employer] for patients greater                          than or equal to 71 year old.                          It is not intended                          to diagnose infection nor to                          guide or monitor treatment.  Marland Kitchen aPTT 08/01/2012 28  24 - 37 seconds Final  . WBC 08/01/2012 7.1  4.0 - 10.5 K/uL Final  . RBC 08/01/2012 4.67  3.87 -  5.11 MIL/uL Final  . Hemoglobin 08/01/2012 14.2   12.0 - 15.0 g/dL Final  . HCT 16/02/9603 41.9  36.0 - 46.0 % Final  . MCV 08/01/2012 89.7  78.0 - 100.0 fL Final  . MCH 08/01/2012 30.4  26.0 - 34.0 pg Final  . MCHC 08/01/2012 33.9  30.0 - 36.0 g/dL Final  . RDW 54/01/8118 13.0  11.5 - 15.5 % Final  . Platelets 08/01/2012 327  150 - 400 K/uL Final  . Sodium 08/01/2012 136  135 - 145 mEq/L Final  . Potassium 08/01/2012 3.8  3.5 - 5.1 mEq/L Final  . Chloride 08/01/2012 98  96 - 112 mEq/L Final  . CO2 08/01/2012 27  19 - 32 mEq/L Final  . Glucose, Bld 08/01/2012 95  70 - 99 mg/dL Final  . BUN 14/78/2956 12  6 - 23 mg/dL Final  . Creatinine, Ser 08/01/2012 0.76  0.50 - 1.10 mg/dL Final  . Calcium 21/30/8657 9.3  8.4 - 10.5 mg/dL Final  . Total Protein 08/01/2012 7.3  6.0 - 8.3 g/dL Final  . Albumin 84/69/6295 3.6  3.5 - 5.2 g/dL Final  . AST 28/41/3244 31  0 - 37 U/L Final  . ALT 08/01/2012 63* 0 - 35 U/L Final  . Alkaline Phosphatase 08/01/2012 113  39 - 117 U/L Final  . Total Bilirubin 08/01/2012 0.4  0.3 - 1.2 mg/dL Final  . GFR calc non Af Amer 08/01/2012 >90  >90 mL/min Final  . GFR calc Af Amer 08/01/2012 >90  >90 mL/min Final   Comment:                                 The eGFR has been calculated                          using the CKD EPI equation.                          This calculation has not been                          validated in all clinical                          situations.                          eGFR's persistently                          <90 mL/min signify                          possible Chronic Kidney Disease.  Marland Kitchen Prothrombin Time 08/01/2012 12.3  11.6 - 15.2 seconds Final  . INR 08/01/2012 0.92  0.00 - 1.49 Final  . ABO/RH(D) 08/01/2012 O POS   Final  . Antibody Screen 08/01/2012 NEG   Final  . Sample Expiration 08/01/2012 08/11/2012   Final  . Color, Urine 08/01/2012 YELLOW  YELLOW Final  . APPearance 08/01/2012 CLEAR  CLEAR Final  . Specific Gravity, Urine 08/01/2012 1.017  1.005 - 1.030 Final  .  pH 08/01/2012 5.5  5.0 - 8.0 Final  . Glucose, UA 08/01/2012 NEGATIVE  NEGATIVE  mg/dL Final  . Hgb urine dipstick 08/01/2012 NEGATIVE  NEGATIVE Final  . Bilirubin Urine 08/01/2012 NEGATIVE  NEGATIVE Final  . Ketones, ur 08/01/2012 NEGATIVE  NEGATIVE mg/dL Final  . Protein, ur 16/02/9603 NEGATIVE  NEGATIVE mg/dL Final  . Urobilinogen, UA 08/01/2012 0.2  0.0 - 1.0 mg/dL Final  . Nitrite 54/01/8118 NEGATIVE  NEGATIVE Final  . Leukocytes, UA 08/01/2012 NEGATIVE  NEGATIVE Final   MICROSCOPIC NOT DONE ON URINES WITH NEGATIVE PROTEIN, BLOOD, LEUKOCYTES, NITRITE, OR GLUCOSE <1000 mg/dL.  . ABO/RH(D) 08/01/2012 O POS   Final     X-Rays:Dg Chest 2 View  08/01/2012  *RADIOLOGY REPORT*  Clinical Data: Preop left total hip  CHEST - 2 VIEW  Comparison: None.  Findings: Lungs are clear. No pleural effusion or pneumothorax.  Cardiomediastinal silhouette is within normal limits.  Mild degenerative changes of the visualized thoracolumbar spine.  IMPRESSION: No evidence of acute cardiopulmonary disease.   Original Report Authenticated By: Charline Bills, M.D.    Dg Hip Complete Left  08/01/2012  *RADIOLOGY REPORT*  Clinical Data: Left hip pain  LEFT HIP - COMPLETE 2+ VIEW  Comparison: 08/27/2008  Findings: Severe degenerative changes of the left hip.  Mild degenerative changes of the right hip and lower lumbar spine.  No fracture or dislocation is seen.  IMPRESSION: Severe degenerative changes of the left hip, markedly progressed from 2010.   Original Report Authenticated By: Charline Bills, M.D.    Dg Pelvis Portable  08/08/2012  *RADIOLOGY REPORT*  Clinical Data: Postop  PORTABLE PELVIS  Comparison: None.  Findings: Left total hip arthroplasty is in place. Anatomic alignment of the osseous and prosthetic structures.  Surgical drain projects over the operative site.  No breakage or loosening of the hardware.  Degenerative changes in the right hip joint are noted.  IMPRESSION: Left total arthroplasty  anatomically aligned.   Original Report Authenticated By: Jolaine Click, M.D.    Dg C-arm 1-60 Min-no Report  08/08/2012  CLINICAL DATA: anterior hip   C-ARM 1-60 MINUTES  Fluoroscopy was utilized by the requesting physician.  No radiographic  interpretation.      EKG: Orders placed during the hospital encounter of 08/08/12  . EKG 12-LEAD  . EKG 12-LEAD  . EKG 12-LEAD  . EKG 12-LEAD     Hospital Course: Patient was admitted to [facility] and taken to the OR and underwent the above state procedure but experienced a run of bigemeny or ventricular tach during the procedure.  It was only a short run and the patient converted back to a sinus rhythm almost immediately.  Patient tolerated the rest of the procedure well and was later transferred to the recovery room and then to the orthopaedic floor for postoperative care.  They were given PO and IV analgesics for pain control following their surgery.  They were given 24 hours of postoperative antibiotics of  Anti-infectives   Start     Dose/Rate Route Frequency Ordered Stop   08/08/12 2100  ceFAZolin (ANCEF) IVPB 1 g/50 mL premix     1 g 100 mL/hr over 30 Minutes Intravenous Every 6 hours 08/08/12 1901 08/09/12 0251   08/08/12 1145  ceFAZolin (ANCEF) IVPB 2 g/50 mL premix     2 g 100 mL/hr over 30 Minutes Intravenous On call to O.R. 08/08/12 1136 08/08/12 1431     and started on DVT prophylaxis in the form of Xarelto.   PT and OT were ordered for total hip protocol.  The patient was allowed  to be WBAT with therapy. Discharge planning was consulted to help with postop disposition and equipment needs.   Cardiology was consulted due to the arrhythmia issue.  Seen in consultation by Dr. Eldridge Dace.  EKG: pending  ASSESSMENT: Question of arrhythmia.  PLAN: No signs of cardiac disease. R/O for MI with enzymes. Watch on telemetry. Echo to evaluate for structural heart disease.  No PVD on prior studies. I suspect that the brief arrhythmia she had  was related to the stress of the surgery. If above tests are normal, no further testing needed. If arrhythmia noted on tele, we can set up for outpatient monitoring.  Patient had a decent night on the evening of surgery.   POD 1 - Seen in consultation by Dr. Anne Fu following surgery the next day. Assessment/Plan:  Principal Problem:  OA (osteoarthritis) of hip  57 year old with operative arrhythmia (possible bigeminy, NSVT).  - Troponin reassuring.  - ECHO reassuring  - Tele with PVC's noted. In the setting of structurally normal heart with no high risk symptoms such as syncope or angina no further treatment is needed.  - No need for outpatient event monitor.  Will sign off. Please call with questions. Discussed with patient.  OK from cardiac standpoint for SNF.  DVT Prophylaxis - Xarelto.  They started to get up OOB with therapy on day one walking about 25 feet.  Hemovac drain was pulled without difficulty.  The knee immobilizer was removed and discontinued.   POD 2 - Continued to work with therapy into day two.  Dressing was changed on day two and the incision was healing well.  Patient was seen in rounds by Dr. Lequita Halt and it was felt that she would be ready today 08/10/2012.  If insurance approval is not obtained, the probably go the following day, Saturday 08/11/2012 if she continued to do well. Plan was to go to [facility]. Arrangements were being made for tentative discharge today or the following day.  She will be rechecked by the weekend coverage staff on Saturday if still in house, and if doing well, she will be transferred over to [facility] on 08/11/2012.   Discharge Medications: Prior to Admission medications   Medication Sig Start Date End Date Taking? Authorizing Provider  lisinopril-hydrochlorothiazide (PRINZIDE,ZESTORETIC) 10-12.5 MG per tablet Take 1 tablet by mouth every morning.    Yes Historical Provider, MD  omeprazole (PRILOSEC) 20 MG capsule Take 20 mg by mouth every  morning.   Yes Historical Provider, MD  OVER THE COUNTER MEDICATION OTC PAMPARIN AT BEDTIME IF NEEDED FOR SLEEP   Yes Historical Provider, MD  simvastatin (ZOCOR) 20 MG tablet Take 20 mg by mouth every morning.   Yes Historical Provider, MD  acetaminophen (TYLENOL) 325 MG tablet Take 2 tablets (650 mg total) by mouth every 6 (six) hours as needed. 08/10/12   Saoirse Legere Julien Girt, PA-C  bisacodyl (DULCOLAX) 10 MG suppository Place 1 suppository (10 mg total) rectally daily as needed. 08/10/12   Kemonie Cutillo Julien Girt, PA-C  diphenhydrAMINE (BENADRYL) 12.5 MG/5ML elixir Take 5-10 mLs (12.5-25 mg total) by mouth every 4 (four) hours as needed for itching. 08/10/12   Mitch Arquette, PA-C  docusate sodium 100 MG CAPS Take 100 mg by mouth 2 (two) times daily. 08/10/12   Linh Hedberg, PA-C  methocarbamol (ROBAXIN) 500 MG tablet Take 1 tablet (500 mg total) by mouth every 6 (six) hours as needed. 08/10/12   Jezabelle Chisolm Julien Girt, PA-C  metoCLOPramide (REGLAN) 5 MG tablet Take 1-2 tablets (5-10 mg  total) by mouth every 8 (eight) hours as needed (if ondansetron (ZOFRAN) ineffective.). 08/10/12   Camile Esters, PA-C  ondansetron (ZOFRAN) 4 MG tablet Take 1 tablet (4 mg total) by mouth every 6 (six) hours as needed for nausea. 08/10/12   Georgette Helmer, PA-C  oxyCODONE (OXY IR/ROXICODONE) 5 MG immediate release tablet Take 1-2 tablets (5-10 mg total) by mouth every 3 (three) hours as needed. 08/10/12   Tarin Johndrow, PA-C  polyethylene glycol (MIRALAX / GLYCOLAX) packet Take 17 g by mouth daily as needed. 08/10/12   Roshan Roback Julien Girt, PA-C  rivaroxaban (XARELTO) 10 MG TABS tablet Take 1 tablet (10 mg total) by mouth daily with breakfast. Take Xarelto for two and a half more weeks, then discontinue Xarelto. 08/10/12   Lamine Laton, PA-C  traMADol (ULTRAM) 50 MG tablet Take 100 mg by mouth at bedtime.    Historical Provider, MD    Diet: Cardiac diet Activity:WBAT Follow-up:in 2 weeks Disposition -  Skilled nursing facility - [facility] Discharged Condition: Good at time of summary.  Transfer today if insurance approves, if not then keep and if doing well on Saturday, the transfer on 08/11/2012.   Discharge Orders   Future Orders Complete By Expires     Call MD / Call 911  As directed     Comments:      If you experience chest pain or shortness of breath, CALL 911 and be transported to the hospital emergency room.  If you develope a fever above 101 F, pus (white drainage) or increased drainage or redness at the wound, or calf pain, call your surgeon's office.    Change dressing  As directed     Comments:      You may change your dressing dressing daily with sterile 4 x 4 inch gauze dressing and paper tape.  Do not submerge the incision under water.    Constipation Prevention  As directed     Comments:      Drink plenty of fluids.  Prune juice may be helpful.  You may use a stool softener, such as Colace (over the counter) 100 mg twice a day.  Use MiraLax (over the counter) for constipation as needed.    Diet - low sodium heart healthy  As directed     Discharge instructions  As directed     Comments:      Pick up stool softner and laxative for home. Do not submerge incision under water. May shower. Continue to use ice for pain and swelling from surgery.  Take Xarelto for two and a half more weeks, then discontinue Xarelto.  When discharged from the skilled rehab facility, please have the facility set up the patient's Home Health Physical Therapy prior to being released.  Also provide the patient with their medications at time of release from the facility to include their pain medication, the muscle relaxants, and their blood thinner medication.  If the patient is still at the rehab facility at time of follow up appointment, please also assist the patient in arranging follow up appointment in our office and any transportation needs.    Do not sit on low chairs, stoools or toilet seats,  as it may be difficult to get up from low surfaces  As directed     Driving restrictions  As directed     Comments:      No driving until released by the physician.    Increase activity slowly as tolerated  As directed  Lifting restrictions  As directed     Comments:      No lifting until released by the physician.    Patient may shower  As directed     Comments:      You may shower without a dressing once there is no drainage.  Do not wash over the wound.  If drainage remains, do not shower until drainage stops.    TED hose  As directed     Comments:      Use stockings (TED hose) for 3 weeks on both leg(s).  You may remove them at night for sleeping.    Weight bearing as tolerated  As directed         Medication List    STOP taking these medications       diclofenac 75 MG EC tablet  Commonly known as:  VOLTAREN      TAKE these medications       acetaminophen 325 MG tablet  Commonly known as:  TYLENOL  Take 2 tablets (650 mg total) by mouth every 6 (six) hours as needed.     bisacodyl 10 MG suppository  Commonly known as:  DULCOLAX  Place 1 suppository (10 mg total) rectally daily as needed.     diphenhydrAMINE 12.5 MG/5ML elixir  Commonly known as:  BENADRYL  Take 5-10 mLs (12.5-25 mg total) by mouth every 4 (four) hours as needed for itching.     DSS 100 MG Caps  Take 100 mg by mouth 2 (two) times daily.     lisinopril-hydrochlorothiazide 10-12.5 MG per tablet  Commonly known as:  PRINZIDE,ZESTORETIC  Take 1 tablet by mouth every morning.     methocarbamol 500 MG tablet  Commonly known as:  ROBAXIN  Take 1 tablet (500 mg total) by mouth every 6 (six) hours as needed.     metoCLOPramide 5 MG tablet  Commonly known as:  REGLAN  Take 1-2 tablets (5-10 mg total) by mouth every 8 (eight) hours as needed (if ondansetron (ZOFRAN) ineffective.).     omeprazole 20 MG capsule  Commonly known as:  PRILOSEC  Take 20 mg by mouth every morning.     ondansetron 4 MG  tablet  Commonly known as:  ZOFRAN  Take 1 tablet (4 mg total) by mouth every 6 (six) hours as needed for nausea.     OVER THE COUNTER MEDICATION  OTC PAMPARIN AT BEDTIME IF NEEDED FOR SLEEP     oxyCODONE 5 MG immediate release tablet  Commonly known as:  Oxy IR/ROXICODONE  Take 1-2 tablets (5-10 mg total) by mouth every 3 (three) hours as needed.     polyethylene glycol packet  Commonly known as:  MIRALAX / GLYCOLAX  Take 17 g by mouth daily as needed.     rivaroxaban 10 MG Tabs tablet  Commonly known as:  XARELTO  Take 1 tablet (10 mg total) by mouth daily with breakfast. Take Xarelto for two and a half more weeks, then discontinue Xarelto.     simvastatin 20 MG tablet  Commonly known as:  ZOCOR  Take 20 mg by mouth every morning.     traMADol 50 MG tablet  Commonly known as:  ULTRAM  Take 100 mg by mouth at bedtime.           Follow-up Information   Follow up with Loanne Drilling, MD. Schedule an appointment as soon as possible for a visit in 2 weeks.   Contact information:   [address],  [address] [place] [address] [phone]       Signed: Patrica Duel 08/10/2012, 10:11 AM

## 2012-08-10 NOTE — Progress Notes (Signed)
Patient cleared for discharge. auth obtained from [employer]. Packet copied and placed in [place]. ptar called for transportation. Patient to notify her husband.  Mary Munoz C. Rayme Bui MSW, LCSW [phone]

## 2012-08-10 NOTE — Progress Notes (Signed)
   Subjective: 2 Days Post-Op Procedure(s) (LRB): TOTAL HIP ARTHROPLASTY ANTERIOR APPROACH (Left) Patient reports pain as mild.   Patient seen in rounds with Dr. Lequita Halt.  Patient stated that she was feeling better. Patient is well, and has had no acute complaints or problems Patient was planning on going to [facility] tomorrow, Saturday 08/11/2012.  Objective: Vital signs in last 24 hours: Temp:  [97.8 F (36.6 C)-99.7 F (37.6 C)] 99 F (37.2 C) (04/04 0551) Pulse Rate:  [88-104] 93 (04/04 0551) Resp:  [15-18] 15 (04/04 0745) BP: (125-150)/(60-84) 150/79 mmHg (04/04 0551) SpO2:  [95 %-99 %] 95 % (04/04 0551)  Intake/Output from previous day:  Intake/Output Summary (Last 24 hours) at 08/10/12 1002 Last data filed at 08/10/12 0835  Gross per 24 hour  Intake 1181.83 ml  Output   1550 ml  Net -368.17 ml    Intake/Output this shift: Total I/O In: 480 [P.O.:480] Out: -   Labs:  Recent Labs  08/09/12 0420 08/10/12 0436  HGB 10.6* 10.3*    Recent Labs  08/09/12 0420 08/10/12 0436  WBC 13.3* 16.4*  RBC 3.46* 3.33*  HCT 30.8* 29.9*  PLT 308 290    Recent Labs  08/09/12 0420 08/10/12 0436  NA 136 140  K 3.9 3.3*  CL 101 104  CO2 28 27  BUN 11 13  CREATININE 0.75 0.75  GLUCOSE 143* 134*  CALCIUM 8.9 8.8   No results found for this basename: LABPT, INR,  in the last 72 hours  EXAM: General - Patient is Alert, Appropriate and Oriented Extremity - Neurovascular intact Sensation intact distally Dorsiflexion/Plantar flexion intact No cellulitis present Incision - clean, dry, no drainage, healing Motor Function - intact, moving foot and toes well on exam.   Assessment/Plan: 2 Days Post-Op Procedure(s) (LRB): TOTAL HIP ARTHROPLASTY ANTERIOR APPROACH (Left) Procedure(s) (LRB): TOTAL HIP ARTHROPLASTY ANTERIOR APPROACH (Left) Past Medical History  Diagnosis Date  . Hip discomfort     left  . Hypertension   . Arthritis     OA LEFT HIP AND LUMBOSACRAL  DISC DEGENERATION  . Hypercholesterolemia   . Sleep apnea     USES CPAP   . History of shingles ? 2010    NO RESIDUAL PROBLEMS FROM SHINGLES  . Thyroid nodule     RIGHT--UNDER OBSERVATION-YEARLY BLOOD WORK-NO MEDICATION  . Cardiac dysrhythmia, unspecified    Principal Problem:   OA (osteoarthritis) of hip Active Problems:   Postoperative anemia due to acute blood loss   Postop Hypokalemia  Estimated body mass index is 31.45 kg/(m^2) as calculated from the following:   Height as of this encounter: 5\' 2"  (1.575 m).   Weight as of this encounter: 78.019 kg (172 lb). Up with therapy Plan for discharge tomorrow Discharge to SNF - [facility] Diet - Cardiac diet Follow up - in 2 weeks Activity - WBAT Disposition - Skilled nursing facility - [facility] D/C Meds - See DC Summary DVT Prophylaxis - Xarelto  Grayson White 08/10/2012, 10:02 AM

## 2012-08-15 ENCOUNTER — Non-Acute Institutional Stay (SKILLED_NURSING_FACILITY): Payer: BC Managed Care – PPO | Admitting: Nurse Practitioner

## 2012-08-15 DIAGNOSIS — Z7901 Long term (current) use of anticoagulants: Secondary | ICD-10-CM

## 2012-08-15 DIAGNOSIS — Z96649 Presence of unspecified artificial hip joint: Secondary | ICD-10-CM

## 2012-08-15 DIAGNOSIS — M1612 Unilateral primary osteoarthritis, left hip: Secondary | ICD-10-CM

## 2012-08-15 DIAGNOSIS — M169 Osteoarthritis of hip, unspecified: Secondary | ICD-10-CM

## 2012-08-15 DIAGNOSIS — R269 Unspecified abnormalities of gait and mobility: Secondary | ICD-10-CM

## 2012-08-15 DIAGNOSIS — I10 Essential (primary) hypertension: Secondary | ICD-10-CM

## 2012-08-17 ENCOUNTER — Encounter: Payer: Self-pay | Admitting: Nurse Practitioner

## 2012-08-17 NOTE — Progress Notes (Signed)
  Subjective:    Patient ID: Mary Munoz, female    DOB: 08/31/1955, 57 y.o.   MRN: [id number]  HPI Comments: Pt was seen today for discharge from [facility]. She was here s/p left total hip arthroplasty with end stage osteoarthritis. She will discharge home with husband and home health services. She has been stable here and has progressed well with therapy. She is on Xarelto 10mg  po qd for dvt prophylaxis due to end on 08/28/12, total of 2.5 weeks.     Review of Systems  Constitutional: Negative.   HENT: Negative.   Eyes: Negative.   Respiratory: Negative.   Cardiovascular: Negative.   Gastrointestinal: Negative.   Endocrine: Negative.   Genitourinary: Negative.   Musculoskeletal:       Post operative pain to left hip.  Skin: Negative.   Allergic/Immunologic: Negative.   Neurological: Negative.   Hematological: Negative.   Psychiatric/Behavioral: Negative.        Objective:   Physical Exam  Constitutional: She is oriented to person, place, and time. No distress.  Eyes: Pupils are equal, round, and reactive to light.  Cardiovascular: Normal rate and regular rhythm.   Pulmonary/Chest: Effort normal and breath sounds normal.  Musculoskeletal:  rom decreased to left hip s/p THA. Normal rom to right hip and lower extremity.  Neurological: She is alert and oriented to person, place, and time.  Skin: Skin is warm and dry. She is not diaphoretic.  Left hip incision stable.  Psychiatric: She has a normal mood and affect.   08/14/12: ldl 83, cholseterol 135, triglyceride 110, hdl 30, wbc 12.9, hgb 10.4, hct 31.1, plt 466, na 137, k 3.9, glucose 76, bun 15, creatinine 0.79, calcium 9.1, alk phos 125, ast 29, alt 54, total protein 6.2, albumin 3.4.       Assessment & Plan:  1) Discharge home on 08/16/12 with home health pt/ot/RN (incision care). 2) continue Xarelto 10 mg po qd till 08/28/12 as directed. 3) DME: rolling walker, shower chair. 4) pt is to follow up with ortho as  directed, and pcp in 1-2 weeks. 5) Pt has [employer] and is able to take her medication supply home from facility.

## 2012-10-03 ENCOUNTER — Other Ambulatory Visit: Payer: Self-pay | Admitting: Family Medicine

## 2012-10-03 DIAGNOSIS — Z1231 Encounter for screening mammogram for malignant neoplasm of breast: Secondary | ICD-10-CM

## 2012-10-16 ENCOUNTER — Ambulatory Visit: Payer: BC Managed Care – PPO

## 2012-12-06 ENCOUNTER — Other Ambulatory Visit: Payer: Self-pay

## 2012-12-06 ENCOUNTER — Ambulatory Visit
Admission: RE | Admit: 2012-12-06 | Discharge: 2012-12-06 | Disposition: A | Discharge status: Home / Self Care | Payer: BC Managed Care – PPO | Source: Ambulatory Visit | Attending: Family Medicine | Admitting: Family Medicine

## 2012-12-06 DIAGNOSIS — Z1231 Encounter for screening mammogram for malignant neoplasm of breast: Secondary | ICD-10-CM

## 2012-12-10 ENCOUNTER — Other Ambulatory Visit: Payer: Self-pay

## 2012-12-10 DIAGNOSIS — R928 Other abnormal and inconclusive findings on diagnostic imaging of breast: Secondary | ICD-10-CM

## 2012-12-25 ENCOUNTER — Ambulatory Visit
Admission: RE | Admit: 2012-12-25 | Discharge: 2012-12-25 | Disposition: A | Discharge status: Home / Self Care | Payer: BC Managed Care – PPO | Source: Ambulatory Visit

## 2012-12-25 DIAGNOSIS — R928 Other abnormal and inconclusive findings on diagnostic imaging of breast: Secondary | ICD-10-CM

## 2013-01-08 ENCOUNTER — Other Ambulatory Visit: Payer: Self-pay | Admitting: Orthopedic Surgery

## 2013-01-15 ENCOUNTER — Encounter (HOSPITAL_COMMUNITY): Payer: Self-pay | Admitting: Pharmacy Technician

## 2013-01-17 ENCOUNTER — Encounter (HOSPITAL_COMMUNITY)
Admission: RE | Admit: 2013-01-17 | Discharge: 2013-01-17 | Disposition: A | Discharge status: Home / Self Care | Payer: BC Managed Care – PPO | Source: Ambulatory Visit | Attending: Orthopedic Surgery | Admitting: Orthopedic Surgery

## 2013-01-17 ENCOUNTER — Encounter (HOSPITAL_COMMUNITY): Payer: Self-pay

## 2013-01-17 ENCOUNTER — Ambulatory Visit (HOSPITAL_COMMUNITY)
Admission: RE | Admit: 2013-01-17 | Discharge: 2013-01-17 | Disposition: A | Discharge status: Home / Self Care | Payer: BC Managed Care – PPO | Source: Ambulatory Visit | Attending: Orthopedic Surgery | Admitting: Orthopedic Surgery

## 2013-01-17 DIAGNOSIS — Z01818 Encounter for other preprocedural examination: Secondary | ICD-10-CM | POA: Insufficient documentation

## 2013-01-17 DIAGNOSIS — M1610 Unilateral primary osteoarthritis, unspecified hip: Secondary | ICD-10-CM | POA: Insufficient documentation

## 2013-01-17 DIAGNOSIS — Z01812 Encounter for preprocedural laboratory examination: Secondary | ICD-10-CM | POA: Insufficient documentation

## 2013-01-17 DIAGNOSIS — M169 Osteoarthritis of hip, unspecified: Secondary | ICD-10-CM | POA: Insufficient documentation

## 2013-01-17 LAB — URINALYSIS, ROUTINE W REFLEX MICROSCOPIC
Glucose, UA: NEGATIVE mg/dL
Ketones, ur: NEGATIVE mg/dL
Leukocytes, UA: NEGATIVE
Nitrite: NEGATIVE
Protein, ur: NEGATIVE mg/dL
pH: 5 (ref 5.0–8.0)

## 2013-01-17 LAB — CBC
HCT: 40.7 % (ref 36.0–46.0)
MCH: 28.3 pg (ref 26.0–34.0)
MCHC: 33.2 g/dL (ref 30.0–36.0)
MCV: 85.3 fL (ref 78.0–100.0)
Platelets: 366 10*3/uL (ref 150–400)
RDW: 15.4 % (ref 11.5–15.5)

## 2013-01-17 LAB — COMPREHENSIVE METABOLIC PANEL
AST: 21 U/L (ref 0–37)
Albumin: 3.9 g/dL (ref 3.5–5.2)
BUN: 13 mg/dL (ref 6–23)
Calcium: 9.8 mg/dL (ref 8.4–10.5)
Chloride: 100 mEq/L (ref 96–112)
Creatinine, Ser: 0.77 mg/dL (ref 0.50–1.10)
Total Bilirubin: 0.3 mg/dL (ref 0.3–1.2)

## 2013-01-17 LAB — PROTIME-INR
INR: 1 (ref 0.00–1.49)
Prothrombin Time: 13 seconds (ref 11.6–15.2)

## 2013-01-17 NOTE — Patient Instructions (Signed)
Mary Munoz  01/17/2013   Your procedure is scheduled on: 01/23/13              Surgery 1100am-100pm   Report to [facility] at     0830  AM.  Call this number if you have problems the morning of surgery: [phone]   Remember:   Do not eat food or drink liquids after midnight.   Take these medicines the morning of surgery with A SIP OF WATER:    Do not wear jewelry, make-up or nail polish.  Do not wear lotions, powders, or perfumes.   Do not shave 48 hours prior to surgery.   Do not bring valuables to the hospital.  Contacts, dentures or bridgework may not be worn into surgery.  Leave suitcase in the car. After surgery it may be brought to your room.  For patients admitted to the hospital, checkout time is 11:00 AM the day of  discharge.     SEE CHG INSTRUCTION SHEET    Please read over the following fact sheets that you were given: MRSA Information, coughing and deep breathing exercises, leg exercises, Blood Transfusion Fact Sheet, Incentive Spirometry Fact sheet                Failure to comply with these instructions may result in cancellation of your surgery.                Patient Signature ____________________________              Nurse Signature _____________________________

## 2013-01-17 NOTE — Progress Notes (Signed)
ECHO done 08/09/12 after left hip arthroplasty due to arrhythmia ( pvc's  during surgery.  Dr Anne Fu evaluted per notes while in hospital  And echo performed.  No further treatment received per patient.

## 2013-01-22 ENCOUNTER — Other Ambulatory Visit: Payer: Self-pay | Admitting: Orthopedic Surgery

## 2013-01-22 NOTE — H&P (Signed)
Mary Munoz  DOB: 24-Feb-1956 Married / Language: English / Race: White Female  Date of Admission:  01/23/2013  Chief Complaint:  Right Hip Pain  History of Present Illness The patient is a 57 year old female who comes in for a preoperative History and Physical. The patient is scheduled for a right total hip arthroplasty (anterior approach) to be performed by Dr. Gus Rankin. Aluisio, MD at [facility] on 01/23/2013. The patient is a 57 year old female who presents today for follow up of their hip. The patient is being followed for their right hip pain and osteoarthritis. Symptoms reported today include: pain. Current treatment includes: pain medications and cane. The following medication has been used for pain control: Hydrocodone. The patient presents today following post intra-articular hip injection. Note for "Follow-up Hip": Injection helped alittle, but still having pain. Mary Munoz has been having a significant increase in pain in that right hip. She had the cortisone injection a few weeks ago due to increased pain and it has helped some. She still has considerable pain but not quite as bad. The patient did great with the left hip. She wants to proceed as planned with the right total hip arthroplasty. She did not have any further questions, as she is ready to go ahead and get it fixed. They have been treated conservatively in the past for the above stated problem and despite conservative measures, they continue to have progressive pain and severe functional limitations and dysfunction. They have failed non-operative management including home exercise, medications, and injections. It is felt that they would benefit from undergoing total joint replacement. Risks and benefits of the procedure have been discussed with the patient and they elect to proceed with surgery. There are no active contraindications to surgery such as ongoing infection or rapidly progressive  neurological disease.   Problem List S/P hip replacement (V43.64) Osteoarthritis, Hip (715.35)   Allergies Sulfanilamide *CHEMICALS*. mouth sores Citric Acid *CHEMICALS*. bladder infections   Family History Hypertension. mother, father and sister Osteoarthritis. mother Osteoporosis. mother Diabetes Mellitus. mother Heart Disease. father Cancer. sister, grandmother fathers side and grandfather fathers side Cerebrovascular Accident. father Chronic Obstructive Lung Disease. father   Social History Alcohol use. never consumed alcohol Exercise. Exercises rarely; does running / walking Children. 1 Drug/Alcohol Rehab (Currently). no Tobacco use. never smoker Illicit drug use. no Living situation. 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 married Pain Contract. no Tobacco / smoke exposure. no Number of flights of stairs before winded. 4-5 Previously in rehab. no Post-Surgical Plans. Home with family   Medication History Lisinopril-Hydrochlorothiazide (10-12.5MG  Tablet, Oral) Active. Norco (5-325MG  Tablet, 1 (one) Oral every 8 hours as needed for pain, Taken starting 01/15/2013) Active.    Past Surgical History  Breast Biopsy. Date: 31. Dilation and Curettage of Uterus. Date: 09/2011. Tubal Ligation. Date: 55. Total Hip Replacement - Left. Date: 08/2012.  Medical History Urinary Tract Infection Sleep Apnea. uses CPAP Measles Menopause Mumps Chronic Pain Shingles High blood pressure Hypercholesterolemia   Review of Systems  General:Not Present- Chills, Fever, Night Sweats, Fatigue, Weight Gain, Weight Loss and Memory Loss. Skin:Not Present- Hives, Itching, Rash, Eczema and Lesions. HEENT:Not Present- Tinnitus, Headache, Double Vision, Visual Loss, Hearing Loss and Dentures. Respiratory:Not Present- Shortness of breath with exertion, Shortness of  breath at rest, Allergies, Coughing up blood and Chronic Cough. Cardiovascular:Not Present- Chest Pain, Racing/skipping heartbeats, Difficulty Breathing Lying Down, Murmur, Swelling and Palpitations. Gastrointestinal:Not Present- Bloody Stool,  Heartburn, Abdominal Pain, Vomiting, Nausea, Constipation, Diarrhea, Difficulty Swallowing, Jaundice and Loss of appetitie. Female Genitourinary:Not Present- Blood in Urine, Urinary frequency, Weak urinary stream, Discharge, Flank Pain, Incontinence, Painful Urination, Urgency, Urinary Retention and Urinating at Night. Musculoskeletal:Present- Joint Pain. Not Present- Muscle Weakness, Muscle Pain, Joint Swelling, Back Pain, Morning Stiffness and Spasms. Neurological:Not Present- Tremor, Dizziness, Blackout spells, Paralysis, Difficulty with balance and Weakness. Psychiatric:Not Present- Insomnia.   Vitals  Pulse: 84 (Regular) Resp.: 16 (Unlabored) BP: 158/88 (Sitting, Left Arm, Standard)    Physical Exam  The physical exam findings are as follows:  Note: Patient is a 57 year old female with continued hip pain.   General Mental Status - Alert, cooperative and good historian. General Appearance- pleasant. Not in acute distress. Orientation- Oriented X3. Build & Nutrition- Well nourished and Well developed.   Head and Neck Head- normocephalic, atraumatic . Neck Global Assessment- supple. no bruit auscultated on the right and no bruit auscultated on the left.   Eye Vision- Wears corrective lenses. Pupil- Bilateral- Regular and Round. Motion- Bilateral- EOMI.   Chest and Lung Exam Auscultation: Breath sounds:- clear at anterior chest wall and - clear at posterior chest wall. Adventitious sounds:- No Adventitious sounds.   Cardiovascular Auscultation:Rhythm- Regular rate and rhythm. Heart Sounds- S1 WNL and S2 WNL. Murmurs & Other Heart Sounds:Auscultation of the heart reveals - No  Murmurs.   Abdomen Palpation/Percussion:Tenderness- Abdomen is non-tender to palpation. Rigidity (guarding)- Abdomen is soft. Auscultation:Auscultation of the abdomen reveals - Bowel sounds normal.   Female Genitourinary  Not done, not pertinent to present illness  Musculoskeletal  Well-developed female alert and oriented in no apparent distress.  On examination, her right hip can still be flexed to about 100 degrees with minimal internal rotation, about 20 degrees external rotation, 20 degrees abduction.  Assessment & Plan  Osteoarthritis, Hip (715.35) Impression: Right Hip  Note: Plan is for a Right Total Hip Replacement - Anterior Approach by Dr. Lequita Halt.  Plan is to go home.  PCP - Dr. Farris Has  The patient does not have any contraindications and will receive TXA (tranexamic acid) prior to surgery.  Signed electronically by Lauraine Rinne, III PA-C

## 2013-01-23 ENCOUNTER — Encounter (HOSPITAL_COMMUNITY)
Admission: RE | Disposition: A | Discharge status: Home Health | Payer: Self-pay | Source: Ambulatory Visit | Attending: Orthopedic Surgery

## 2013-01-23 ENCOUNTER — Encounter (HOSPITAL_COMMUNITY): Payer: Self-pay | Admitting: *Deleted

## 2013-01-23 ENCOUNTER — Inpatient Hospital Stay (HOSPITAL_COMMUNITY): Payer: BC Managed Care – PPO

## 2013-01-23 ENCOUNTER — Inpatient Hospital Stay (HOSPITAL_COMMUNITY)
Admission: RE | Admit: 2013-01-23 | Discharge: 2013-01-25 | DRG: 818 | Disposition: A | Discharge status: Home Health | Payer: BC Managed Care – PPO | Source: Ambulatory Visit | Attending: Orthopedic Surgery | Admitting: Orthopedic Surgery

## 2013-01-23 ENCOUNTER — Inpatient Hospital Stay (HOSPITAL_COMMUNITY): Payer: BC Managed Care – PPO | Admitting: Anesthesiology

## 2013-01-23 ENCOUNTER — Encounter (HOSPITAL_COMMUNITY): Payer: Self-pay | Admitting: Anesthesiology

## 2013-01-23 DIAGNOSIS — Z96649 Presence of unspecified artificial hip joint: Secondary | ICD-10-CM

## 2013-01-23 DIAGNOSIS — E7800 Pure hypercholesterolemia, unspecified: Secondary | ICD-10-CM | POA: Diagnosis present

## 2013-01-23 DIAGNOSIS — M1610 Unilateral primary osteoarthritis, unspecified hip: Principal | ICD-10-CM | POA: Diagnosis present

## 2013-01-23 DIAGNOSIS — M169 Osteoarthritis of hip, unspecified: Secondary | ICD-10-CM

## 2013-01-23 DIAGNOSIS — G4730 Sleep apnea, unspecified: Secondary | ICD-10-CM | POA: Diagnosis present

## 2013-01-23 DIAGNOSIS — I10 Essential (primary) hypertension: Secondary | ICD-10-CM | POA: Diagnosis present

## 2013-01-23 DIAGNOSIS — D62 Acute posthemorrhagic anemia: Secondary | ICD-10-CM

## 2013-01-23 HISTORY — PX: TOTAL HIP ARTHROPLASTY: SHX124

## 2013-01-23 LAB — TYPE AND SCREEN: ABO/RH(D): O POS

## 2013-01-23 SURGERY — ARTHROPLASTY, HIP, TOTAL, ANTERIOR APPROACH
Anesthesia: General | Site: Hip | Laterality: Right | Wound class: Clean

## 2013-01-23 MED ORDER — BUPIVACAINE LIPOSOME 1.3 % IJ SUSP
20.0000 mL | Freq: Once | INTRAMUSCULAR | Status: DC
  Filled 2013-01-23: qty 20

## 2013-01-23 MED ORDER — RIVAROXABAN 10 MG PO TABS
10.0000 mg | ORAL_TABLET | Freq: Every day | ORAL | Status: DC
  Administered 2013-01-24 – 2013-01-25 (×2): 10 mg via ORAL
  Filled 2013-01-23 (×3): qty 1

## 2013-01-23 MED ORDER — DOCUSATE SODIUM 100 MG PO CAPS
100.0000 mg | ORAL_CAPSULE | Freq: Two times a day (BID) | ORAL | Status: DC
  Administered 2013-01-23 – 2013-01-25 (×4): 100 mg via ORAL

## 2013-01-23 MED ORDER — HYDROMORPHONE HCL PF 1 MG/ML IJ SOLN
INTRAMUSCULAR | Status: AC
  Filled 2013-01-23: qty 1

## 2013-01-23 MED ORDER — STERILE WATER FOR IRRIGATION IR SOLN
Status: DC | PRN
  Administered 2013-01-23: 3000 mL

## 2013-01-23 MED ORDER — DIPHENHYDRAMINE HCL 12.5 MG/5ML PO ELIX: 12.5000 mg | ORAL_SOLUTION | ORAL | Status: DC | PRN

## 2013-01-23 MED ORDER — ACETAMINOPHEN 500 MG PO TABS
1000.0000 mg | ORAL_TABLET | Freq: Once | ORAL | Status: AC
  Administered 2013-01-23: 1000 mg via ORAL
  Filled 2013-01-23: qty 2

## 2013-01-23 MED ORDER — MENTHOL 3 MG MT LOZG: 1.0000 | LOZENGE | OROMUCOSAL | Status: DC | PRN

## 2013-01-23 MED ORDER — PHENYLEPHRINE HCL 10 MG/ML IJ SOLN
INTRAMUSCULAR | Status: DC | PRN
  Administered 2013-01-23 (×2): 40 ug via INTRAVENOUS

## 2013-01-23 MED ORDER — SUCCINYLCHOLINE CHLORIDE 20 MG/ML IJ SOLN
INTRAMUSCULAR | Status: DC | PRN
  Administered 2013-01-23: 100 mg via INTRAVENOUS

## 2013-01-23 MED ORDER — ACETAMINOPHEN 500 MG PO TABS
1000.0000 mg | ORAL_TABLET | Freq: Four times a day (QID) | ORAL | Status: AC
  Administered 2013-01-23 – 2013-01-24 (×4): 1000 mg via ORAL
  Filled 2013-01-23 (×5): qty 2

## 2013-01-23 MED ORDER — CEFAZOLIN SODIUM-DEXTROSE 2-3 GM-% IV SOLR
2.0000 g | INTRAVENOUS | Status: AC
  Administered 2013-01-23: 2 g via INTRAVENOUS

## 2013-01-23 MED ORDER — LACTATED RINGERS IV SOLN
INTRAVENOUS | Status: DC | PRN
  Administered 2013-01-23 (×2): via INTRAVENOUS

## 2013-01-23 MED ORDER — TRAMADOL HCL 50 MG PO TABS: 50.0000 mg | ORAL_TABLET | Freq: Four times a day (QID) | ORAL | Status: DC | PRN

## 2013-01-23 MED ORDER — ONDANSETRON HCL 4 MG PO TABS
4.0000 mg | ORAL_TABLET | Freq: Four times a day (QID) | ORAL | Status: DC | PRN
  Filled 2013-01-23: qty 1

## 2013-01-23 MED ORDER — FLEET ENEMA 7-19 GM/118ML RE ENEM: 1.0000 | ENEMA | Freq: Once | RECTAL | Status: AC | PRN

## 2013-01-23 MED ORDER — BISACODYL 10 MG RE SUPP: 10.0000 mg | Freq: Every day | RECTAL | Status: DC | PRN

## 2013-01-23 MED ORDER — ONDANSETRON HCL 4 MG/2ML IJ SOLN
INTRAMUSCULAR | Status: DC | PRN
  Administered 2013-01-23: 4 mg via INTRAVENOUS

## 2013-01-23 MED ORDER — LACTATED RINGERS IV SOLN: INTRAVENOUS | Status: DC

## 2013-01-23 MED ORDER — SODIUM CHLORIDE 0.9 % IJ SOLN
INTRAMUSCULAR | Status: AC
  Filled 2013-01-23: qty 50

## 2013-01-23 MED ORDER — HYDROMORPHONE HCL PF 1 MG/ML IJ SOLN
0.2500 mg | INTRAMUSCULAR | Status: DC | PRN
  Administered 2013-01-23 (×4): 0.5 mg via INTRAVENOUS

## 2013-01-23 MED ORDER — OXYCODONE HCL 5 MG PO TABS
5.0000 mg | ORAL_TABLET | ORAL | Status: DC | PRN
  Administered 2013-01-23 (×2): 5 mg via ORAL
  Administered 2013-01-24 (×2): 10 mg via ORAL
  Administered 2013-01-24: 5 mg via ORAL
  Administered 2013-01-24 – 2013-01-25 (×6): 10 mg via ORAL
  Filled 2013-01-23: qty 2
  Filled 2013-01-23: qty 1
  Filled 2013-01-23 (×5): qty 2
  Filled 2013-01-23: qty 1
  Filled 2013-01-23 (×2): qty 2
  Filled 2013-01-23: qty 1

## 2013-01-23 MED ORDER — BUPIVACAINE HCL (PF) 0.25 % IJ SOLN
INTRAMUSCULAR | Status: AC
  Filled 2013-01-23: qty 30

## 2013-01-23 MED ORDER — SODIUM CHLORIDE 0.9 % IJ SOLN
INTRAMUSCULAR | Status: DC | PRN
  Administered 2013-01-23: 12:00:00

## 2013-01-23 MED ORDER — MIDAZOLAM HCL 5 MG/5ML IJ SOLN
INTRAMUSCULAR | Status: DC | PRN
  Administered 2013-01-23: 2 mg via INTRAVENOUS

## 2013-01-23 MED ORDER — DEXAMETHASONE 6 MG PO TABS
10.0000 mg | ORAL_TABLET | Freq: Every day | ORAL | Status: AC
  Administered 2013-01-24: 10 mg via ORAL
  Filled 2013-01-23: qty 1

## 2013-01-23 MED ORDER — DEXAMETHASONE SODIUM PHOSPHATE 10 MG/ML IJ SOLN
10.0000 mg | Freq: Once | INTRAMUSCULAR | Status: AC
  Administered 2013-01-23: 10 mg via INTRAVENOUS

## 2013-01-23 MED ORDER — ROCURONIUM BROMIDE 100 MG/10ML IV SOLN
INTRAVENOUS | Status: DC | PRN
  Administered 2013-01-23: 40 mg via INTRAVENOUS

## 2013-01-23 MED ORDER — ONDANSETRON HCL 4 MG/2ML IJ SOLN: 4.0000 mg | Freq: Four times a day (QID) | INTRAMUSCULAR | Status: DC | PRN

## 2013-01-23 MED ORDER — DEXAMETHASONE SODIUM PHOSPHATE 10 MG/ML IJ SOLN
10.0000 mg | Freq: Every day | INTRAMUSCULAR | Status: AC
  Filled 2013-01-23: qty 1

## 2013-01-23 MED ORDER — POLYETHYLENE GLYCOL 3350 17 G PO PACK: 17.0000 g | PACK | Freq: Every day | ORAL | Status: DC | PRN

## 2013-01-23 MED ORDER — CHLORHEXIDINE GLUCONATE 4 % EX LIQD
60.0000 mL | Freq: Once | CUTANEOUS | Status: DC
  Filled 2013-01-23: qty 60

## 2013-01-23 MED ORDER — LIDOCAINE HCL (CARDIAC) 20 MG/ML IV SOLN
INTRAVENOUS | Status: DC | PRN
  Administered 2013-01-23: 100 mg via INTRAVENOUS

## 2013-01-23 MED ORDER — BUPIVACAINE HCL (PF) 0.25 % IJ SOLN
INTRAMUSCULAR | Status: DC | PRN
  Administered 2013-01-23: 20 mL

## 2013-01-23 MED ORDER — 0.9 % SODIUM CHLORIDE (POUR BTL) OPTIME
TOPICAL | Status: DC | PRN
  Administered 2013-01-23: 1000 mL

## 2013-01-23 MED ORDER — DEXTROSE-NACL 5-0.9 % IV SOLN
INTRAVENOUS | Status: DC
  Administered 2013-01-24: 02:00:00 via INTRAVENOUS

## 2013-01-23 MED ORDER — SODIUM CHLORIDE 0.9 % IV SOLN
INTRAVENOUS | Status: DC
  Administered 2013-01-23: 14:00:00 via INTRAVENOUS

## 2013-01-23 MED ORDER — MORPHINE SULFATE 2 MG/ML IJ SOLN
1.0000 mg | INTRAMUSCULAR | Status: DC | PRN
  Administered 2013-01-23 (×2): 2 mg via INTRAVENOUS
  Filled 2013-01-23 (×2): qty 1

## 2013-01-23 MED ORDER — NEOSTIGMINE METHYLSULFATE 1 MG/ML IJ SOLN
INTRAMUSCULAR | Status: DC | PRN
  Administered 2013-01-23: 4 mg via INTRAVENOUS

## 2013-01-23 MED ORDER — KETOROLAC TROMETHAMINE 15 MG/ML IJ SOLN: 15.0000 mg | Freq: Four times a day (QID) | INTRAMUSCULAR | Status: AC | PRN

## 2013-01-23 MED ORDER — METHOCARBAMOL 100 MG/ML IJ SOLN
500.0000 mg | Freq: Four times a day (QID) | INTRAVENOUS | Status: DC | PRN
  Administered 2013-01-23: 500 mg via INTRAVENOUS
  Filled 2013-01-23 (×2): qty 5

## 2013-01-23 MED ORDER — METOCLOPRAMIDE HCL 10 MG PO TABS: 5.0000 mg | ORAL_TABLET | Freq: Three times a day (TID) | ORAL | Status: DC | PRN

## 2013-01-23 MED ORDER — TRANEXAMIC ACID 100 MG/ML IV SOLN
1000.0000 mg | INTRAVENOUS | Status: AC
  Administered 2013-01-23: 1000 mg via INTRAVENOUS
  Filled 2013-01-23: qty 10

## 2013-01-23 MED ORDER — PROPOFOL 10 MG/ML IV BOLUS
INTRAVENOUS | Status: DC | PRN
  Administered 2013-01-23: 150 mg via INTRAVENOUS

## 2013-01-23 MED ORDER — GLYCOPYRROLATE 0.2 MG/ML IJ SOLN
INTRAMUSCULAR | Status: DC | PRN
  Administered 2013-01-23: .3 mg via INTRAVENOUS

## 2013-01-23 MED ORDER — CEFAZOLIN SODIUM-DEXTROSE 2-3 GM-% IV SOLR
INTRAVENOUS | Status: AC
  Filled 2013-01-23: qty 50

## 2013-01-23 MED ORDER — PHENOL 1.4 % MT LIQD: 1.0000 | OROMUCOSAL | Status: DC | PRN

## 2013-01-23 MED ORDER — METOCLOPRAMIDE HCL 5 MG/ML IJ SOLN
5.0000 mg | Freq: Three times a day (TID) | INTRAMUSCULAR | Status: DC | PRN
  Administered 2013-01-23: 10 mg via INTRAVENOUS
  Filled 2013-01-23: qty 2

## 2013-01-23 MED ORDER — METHOCARBAMOL 500 MG PO TABS
500.0000 mg | ORAL_TABLET | Freq: Four times a day (QID) | ORAL | Status: DC | PRN
  Administered 2013-01-23 – 2013-01-25 (×5): 500 mg via ORAL
  Filled 2013-01-23 (×5): qty 1

## 2013-01-23 MED ORDER — SUFENTANIL CITRATE 50 MCG/ML IV SOLN
INTRAVENOUS | Status: DC | PRN
  Administered 2013-01-23 (×3): 10 ug via INTRAVENOUS

## 2013-01-23 MED ORDER — CEFAZOLIN SODIUM 1-5 GM-% IV SOLN
1.0000 g | Freq: Four times a day (QID) | INTRAVENOUS | Status: AC
  Administered 2013-01-23 (×2): 1 g via INTRAVENOUS
  Filled 2013-01-23 (×2): qty 50

## 2013-01-23 SURGICAL SUPPLY — 41 items
BAG SPEC THK2 15X12 ZIP CLS (MISCELLANEOUS) ×2
BAG ZIPLOCK 12X15 (MISCELLANEOUS) ×4 IMPLANT
BLADE SAW SGTL 18X1.27X75 (BLADE) ×2 IMPLANT
CAPT HIP PF COP ×1 IMPLANT
CLOTH BEACON ORANGE TIMEOUT ST (SAFETY) ×2 IMPLANT
DECANTER SPIKE VIAL GLASS SM (MISCELLANEOUS) ×2 IMPLANT
DRAPE C-ARM 42X120 X-RAY (DRAPES) ×2 IMPLANT
DRAPE STERI IOBAN 125X83 (DRAPES) ×2 IMPLANT
DRAPE U-SHAPE 47X51 STRL (DRAPES) ×6 IMPLANT
DRSG ADAPTIC 3X8 NADH LF (GAUZE/BANDAGES/DRESSINGS) ×2 IMPLANT
DRSG MEPILEX BORDER 4X4 (GAUZE/BANDAGES/DRESSINGS) ×2 IMPLANT
DRSG MEPILEX BORDER 4X8 (GAUZE/BANDAGES/DRESSINGS) ×2 IMPLANT
DURAPREP 26ML APPLICATOR (WOUND CARE) ×2 IMPLANT
ELECT BLADE 6.5 EXT (BLADE) ×2 IMPLANT
ELECT REM PT RETURN 9FT ADLT (ELECTROSURGICAL) ×2
ELECTRODE REM PT RTRN 9FT ADLT (ELECTROSURGICAL) ×1 IMPLANT
EVACUATOR 1/8 PVC DRAIN (DRAIN) ×2 IMPLANT
FACESHIELD LNG OPTICON STERILE (SAFETY) ×8 IMPLANT
GLOVE BIO SURGEON STRL SZ7.5 (GLOVE) ×2 IMPLANT
GLOVE BIO SURGEON STRL SZ8 (GLOVE) ×4 IMPLANT
GLOVE BIOGEL PI IND STRL 8 (GLOVE) ×2 IMPLANT
GLOVE BIOGEL PI INDICATOR 8 (GLOVE) ×2
GOWN PREVENTION PLUS LG XLONG (DISPOSABLE) ×2 IMPLANT
GOWN STRL REIN XL XLG (GOWN DISPOSABLE) ×2 IMPLANT
KIT BASIN OR (CUSTOM PROCEDURE TRAY) ×2 IMPLANT
NDL SAFETY ECLIPSE 18X1.5 (NEEDLE) ×2 IMPLANT
NEEDLE HYPO 18GX1.5 SHARP (NEEDLE) ×4
PACK TOTAL JOINT (CUSTOM PROCEDURE TRAY) ×2 IMPLANT
PADDING CAST COTTON 6X4 STRL (CAST SUPPLIES) ×2 IMPLANT
SPONGE GAUZE 4X4 12PLY (GAUZE/BANDAGES/DRESSINGS) IMPLANT
STRIP CLOSURE SKIN 1/2X4 (GAUZE/BANDAGES/DRESSINGS) ×2 IMPLANT
SUCTION FRAZIER 12FR DISP (SUCTIONS) IMPLANT
SUT ETHIBOND NAB CT1 #1 30IN (SUTURE) ×2 IMPLANT
SUT MNCRL AB 4-0 PS2 18 (SUTURE) ×2 IMPLANT
SUT VIC AB 2-0 CT1 27 (SUTURE) ×4
SUT VIC AB 2-0 CT1 TAPERPNT 27 (SUTURE) ×2 IMPLANT
SUT VLOC 180 0 24IN GS25 (SUTURE) ×2 IMPLANT
SYR 20CC LL (SYRINGE) ×2 IMPLANT
SYR 50ML LL SCALE MARK (SYRINGE) ×2 IMPLANT
TOWEL OR 17X26 10 PK STRL BLUE (TOWEL DISPOSABLE) ×2 IMPLANT
TRAY FOLEY CATH 14FRSI W/METER (CATHETERS) ×2 IMPLANT

## 2013-01-23 NOTE — Progress Notes (Signed)
Pt placed on CPAP with home settings of 4.5 via nasal pillows from home, pt tolerating well at this time, RT to monitor and assess as needed.

## 2013-01-23 NOTE — Preoperative (Signed)
Beta Blockers   Reason not to administer Beta Blockers:Not Applicable 

## 2013-01-23 NOTE — Transfer of Care (Signed)
Immediate Anesthesia Transfer of Care Note  Patient: Mary Munoz  Procedure(s) Performed: Procedure(s): RIGHT TOTAL HIP ARTHROPLASTY ANTERIOR APPROACH (Right)  Patient Location: PACU  Anesthesia Type:General  Level of Consciousness: awake, alert  and oriented  Airway & Oxygen Therapy: Patient Spontanous Breathing and Patient connected to face mask oxygen  Post-op Assessment: Report given to PACU RN and Post -op Vital signs reviewed and stable  Post vital signs: Reviewed and stable  Complications: No apparent anesthesia complications

## 2013-01-23 NOTE — Anesthesia Preprocedure Evaluation (Addendum)
Anesthesia Evaluation  Patient identified by MRN, date of birth, ID band Patient awake    Reviewed: Allergy & Precautions, H&P , NPO status , Patient's Chart, lab work & pertinent test results  Airway Mallampati: II TM Distance: >3 FB Neck ROM: full    Dental no notable dental hx. (+) Teeth Intact and Dental Advisory Given   Pulmonary sleep apnea and Continuous Positive Airway Pressure Ventilation ,  breath sounds clear to auscultation  Pulmonary exam normal       Cardiovascular Exercise Tolerance: Good hypertension, Pt. on medications + dysrhythmias Ventricular Tachycardia Rhythm:regular Rate:Normal  PVCs.  Prolonged QT.  Brief run non-sustained VT during last anesthetic.   Neuro/Psych negative neurological ROS  negative psych ROS   GI/Hepatic negative GI ROS, Neg liver ROS,   Endo/Other  negative endocrine ROS  Renal/GU negative Renal ROS  negative genitourinary   Musculoskeletal   Abdominal   Peds  Hematology negative hematology ROS (+)   Anesthesia Other Findings   Reproductive/Obstetrics negative OB ROS                         Anesthesia Physical Anesthesia Plan  ASA: III  Anesthesia Plan: General   Post-op Pain Management:    Induction: Intravenous  Airway Management Planned: Oral ETT  Additional Equipment:   Intra-op Plan:   Post-operative Plan: Extubation in OR  Informed Consent: I have reviewed the patients History and Physical, chart, labs and discussed the procedure including the risks, benefits and alternatives for the proposed anesthesia with the patient or authorized representative who has indicated his/her understanding and acceptance.   Dental Advisory Given  Plan Discussed with: CRNA and Surgeon  Anesthesia Plan Comments:        Anesthesia Quick Evaluation

## 2013-01-23 NOTE — Interval H&P Note (Signed)
History and Physical Interval Note:  01/23/2013 10:03 AM  Syliva Overman  has presented today for surgery, with the diagnosis of OA RIGHT HIP  The various methods of treatment have been discussed with the patient and family. After consideration of risks, benefits and other options for treatment, the patient has consented to  Procedure(s): RIGHT TOTAL HIP ARTHROPLASTY ANTERIOR APPROACH (Right) as a surgical intervention .  The patient's history has been reviewed, patient examined, no change in status, stable for surgery.  I have reviewed the patient's chart and labs.  Questions were answered to the patient's satisfaction.     Loanne Drilling

## 2013-01-23 NOTE — Op Note (Signed)
OPERATIVE REPORT  PREOPERATIVE DIAGNOSIS: Osteoarthritis of the Right hip.   POSTOPERATIVE DIAGNOSIS: Osteoarthritis of the Right  hip.   PROCEDURE: Right total hip arthroplasty, anterior approach.   SURGEON: Ollen Gross, MD   ASSISTANT: Avel Peace, PA-C  ANESTHESIA:  General  ESTIMATED BLOOD LOSS:-* 600 ml   DRAINS: Hemovac x1.   COMPLICATIONS: None   CONDITION: PACU - hemodynamically stable.   BRIEF CLINICAL NOTE: Mary Munoz is a 57 y.o. female who has advanced end-  stage arthritis of his Right  hip with progressively worsening pain and  dysfunction.The patient has failed nonoperative management and presents for  total hip arthroplasty.   PROCEDURE IN DETAIL: After successful administration of spinal  anesthetic, the traction boots for the [facility] bed were placed on both  feet and the patient was placed onto the [facility] bed, boots placed into the leg  holders. The Right hip was then isolated from the perineum with plastic  drapes and prepped and draped in the usual sterile fashion. ASIS and  greater trochanter were marked and a oblique incision was made, starting  at about 1 cm lateral and 2 cm distal to the ASIS and coursing towards  the anterior cortex of the femur. The skin was cut with a 10 blade  through subcutaneous tissue to the level of the fascia overlying the  tensor fascia lata muscle. The fascia was then incised in line with the  incision at the junction of the anterior third and posterior 2/3rd. The  muscle was teased off the fascia and then the interval between the TFL  and the rectus was developed. The Hohmann retractor was then placed at  the top of the femoral neck over the capsule. The vessels overlying the  capsule were cauterized and the fat on top of the capsule was removed.  A Hohmann retractor was then placed anterior underneath the rectus  femoris to give exposure to the entire anterior capsule. A T-shaped  capsulotomy was  performed. The edges were tagged and the femoral head  was identified.       Osteophytes are removed off the superior acetabulum.  The femoral neck was then cut in situ with an oscillating saw. Traction  was then applied to the left lower extremity utilizing the [facility]  traction. The femoral head was then removed. Retractors were placed  around the acetabulum and then circumferential removal of the labrum was  performed. Osteophytes were also removed. Reaming starts at 47 mm to  medialize and  Increased in 2 mm increments to 51 mm. We reamed in  approximately 40 degrees of abduction, 20 degrees anteversion. A 52 mm  pinnacle acetabular shell was then impacted in anatomic position under  fluoroscopic guidance with excellent purchase. We did not need to place  any additional dome screws. A 32 mm neutral + 4 marathon liner was then  placed into the acetabular shell.       The femoral lift was then placed along the lateral aspect of the femur  just distal to the vastus ridge. The leg was  externally rotated and capsule  was stripped off the inferior aspect of the femoral neck down to the  level of the lesser trochanter, this was done with electrocautery. The femur was lifted after this was performed. The  leg was then placed and extended in adducted position to essentially delivering the femur. We also removed the capsule superiorly and the  piriformis from the  piriformis fossa to gain excellent exposure of the  proximal femur. Rongeur was used to remove some cancellous bone to get  into the lateral portion of the proximal femur for placement of the  initial starter reamer. The starter broaches was placed  the starter broach  and was shown to go down the center of the canal. Broaching  with the  Corail system was then performed starting at size 8, coursing  Up to size 12. A size 12 had excellent torsional and rotational  and axial stability. The trial standard offset neck was then placed  with a  32 + 1 trial head. The hip was then reduced. We confirmed that  the stem was in the canal both on AP and lateral x-rays. It also has excellent sizing. The hip was reduced with outstanding stability through full extension, full external rotation,  and then flexion in adduction internal rotation. AP pelvis was taken  and the leg lengths were measured and found to be exactly equal. Hip  was then dislocated again and the femoral head and neck removed. The  femoral broach was removed. Size 12 Corail stem with a standard offset  neck was then impacted into the femur following native anteversion. Has  excellent purchase in the canal. Excellent torsional and rotational and  axial stability. It is confirmed to be in the canal on AP and lateral  fluoroscopic views. The 32 + 1 ceramic head was placed and the hip  reduced with outstanding stability. Again AP pelvis was taken and it  confirmed that the leg lengths were equal. The wound was then copiously  irrigated with saline solution and the capsule reattached and repaired  with Ethibond suture.  20 mL of Exparel mixed with 50 mL of saline then additional 20 ml of .25% Bupivicaine injected into the capsule and into the edge of the tensor fascia lata as well as subcutaneous tissue. The fascia overlying the tensor fascia lata was  then closed with a running #1 V-Loc. Subcu was closed with interrupted  2-0 Vicryl and subcuticular running 4-0 Monocryl. Incision was cleaned  and dried. Steri-Strips and a bulky sterile dressing applied. Hemovac  drain was hooked to suction and then he was awakened and transported to  recovery in stable condition.        Please note that a surgical assistant was a medical necessity for this procedure to perform it in a safe and expeditious manner. Assistant was necessary to provide appropriate retraction of vital neurovascular structures and to prevent femoral fracture and allow for anatomic placement of the prosthesis.  Ollen Gross, M.D.

## 2013-01-23 NOTE — Plan of Care (Signed)
Problem: Consults Goal: Diagnosis- Total Joint Replacement Primary Total Hip     

## 2013-01-23 NOTE — Anesthesia Postprocedure Evaluation (Signed)
  Anesthesia Post-op Note  Patient: Mary Munoz  Procedure(s) Performed: Procedure(s) (LRB): RIGHT TOTAL HIP ARTHROPLASTY ANTERIOR APPROACH (Right)  Patient Location: PACU  Anesthesia Type: General  Level of Consciousness: awake and alert   Airway and Oxygen Therapy: Patient Spontanous Breathing  Post-op Pain: mild  Post-op Assessment: Post-op Vital signs reviewed, Patient's Cardiovascular Status Stable, Respiratory Function Stable, Patent Airway and No signs of Nausea or vomiting  Last Vitals:  Filed Vitals:   01/23/13 1300  BP: 142/86  Pulse: 82  Temp: 37 C  Resp: 21    Post-op Vital Signs: stable   Complications: No apparent anesthesia complications

## 2013-01-23 NOTE — H&P (View-Only) (Signed)
Mary Munoz  DOB: 01/14/1956 Married / Language: English / Race: White Female  Date of Admission:  01/23/2013  Chief Complaint:  Right Hip Pain  History of Present Illness The patient is a 57 year old female who comes in for a preoperative History and Physical. The patient is scheduled for a right total hip arthroplasty (anterior approach) to be performed by Dr. Frank V. Aluisio, MD at [facility] on 01/23/2013. The patient is a 57 year old female who presents today for follow up of their hip. The patient is being followed for their right hip pain and osteoarthritis. Symptoms reported today include: pain. Current treatment includes: pain medications and cane. The following medication has been used for pain control: Hydrocodone. The patient presents today following post intra-articular hip injection. Note for "Follow-up Hip": Injection helped alittle, but still having pain. Mary Munoz has been having a significant increase in pain in that right hip. She had the cortisone injection a few weeks ago due to increased pain and it has helped some. She still has considerable pain but not quite as bad. The patient did great with the left hip. She wants to proceed as planned with the right total hip arthroplasty. She did not have any further questions, as she is ready to go ahead and get it fixed. They have been treated conservatively in the past for the above stated problem and despite conservative measures, they continue to have progressive pain and severe functional limitations and dysfunction. They have failed non-operative management including home exercise, medications, and injections. It is felt that they would benefit from undergoing total joint replacement. Risks and benefits of the procedure have been discussed with the patient and they elect to proceed with surgery. There are no active contraindications to surgery such as ongoing infection or rapidly progressive  neurological disease.   Problem List S/P hip replacement (V43.64) Osteoarthritis, Hip (715.35)   Allergies Sulfanilamide *CHEMICALS*. mouth sores Citric Acid *CHEMICALS*. bladder infections   Family History Hypertension. mother, father and sister Osteoarthritis. mother Osteoporosis. mother Diabetes Mellitus. mother Heart Disease. father Cancer. sister, grandmother fathers side and grandfather fathers side Cerebrovascular Accident. father Chronic Obstructive Lung Disease. father   Social History Alcohol use. never consumed alcohol Exercise. Exercises rarely; does running / walking Children. 1 Drug/Alcohol Rehab (Currently). no Tobacco use. never smoker Illicit drug use. no Living situation. 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 married Pain Contract. no Tobacco / smoke exposure. no Number of flights of stairs before winded. 4-5 Previously in rehab. no Post-Surgical Plans. Home with family   Medication History Lisinopril-Hydrochlorothiazide (10-12.5MG Tablet, Oral) Active. Norco (5-325MG Tablet, 1 (one) Oral every 8 hours as needed for pain, Taken starting 01/15/2013) Active.    Past Surgical History  Breast Biopsy. Date: 1974. Dilation and Curettage of Uterus. Date: 09/2011. Tubal Ligation. Date: 1985. Total Hip Replacement - Left. Date: 08/2012.  Medical History Urinary Tract Infection Sleep Apnea. uses CPAP Measles Menopause Mumps Chronic Pain Shingles High blood pressure Hypercholesterolemia   Review of Systems  General:Not Present- Chills, Fever, Night Sweats, Fatigue, Weight Gain, Weight Loss and Memory Loss. Skin:Not Present- Hives, Itching, Rash, Eczema and Lesions. HEENT:Not Present- Tinnitus, Headache, Double Vision, Visual Loss, Hearing Loss and Dentures. Respiratory:Not Present- Shortness of breath with exertion, Shortness of  breath at rest, Allergies, Coughing up blood and Chronic Cough. Cardiovascular:Not Present- Chest Pain, Racing/skipping heartbeats, Difficulty Breathing Lying Down, Murmur, Swelling and Palpitations. Gastrointestinal:Not Present- Bloody Stool,   Heartburn, Abdominal Pain, Vomiting, Nausea, Constipation, Diarrhea, Difficulty Swallowing, Jaundice and Loss of appetitie. Female Genitourinary:Not Present- Blood in Urine, Urinary frequency, Weak urinary stream, Discharge, Flank Pain, Incontinence, Painful Urination, Urgency, Urinary Retention and Urinating at Night. Musculoskeletal:Present- Joint Pain. Not Present- Muscle Weakness, Muscle Pain, Joint Swelling, Back Pain, Morning Stiffness and Spasms. Neurological:Not Present- Tremor, Dizziness, Blackout spells, Paralysis, Difficulty with balance and Weakness. Psychiatric:Not Present- Insomnia.   Vitals  Pulse: 84 (Regular) Resp.: 16 (Unlabored) BP: 158/88 (Sitting, Left Arm, Standard)    Physical Exam  The physical exam findings are as follows:  Note: Patient is a 57 year old female with continued hip pain.   General Mental Status - Alert, cooperative and good historian. General Appearance- pleasant. Not in acute distress. Orientation- Oriented X3. Build & Nutrition- Well nourished and Well developed.   Head and Neck Head- normocephalic, atraumatic . Neck Global Assessment- supple. no bruit auscultated on the right and no bruit auscultated on the left.   Eye Vision- Wears corrective lenses. Pupil- Bilateral- Regular and Round. Motion- Bilateral- EOMI.   Chest and Lung Exam Auscultation: Breath sounds:- clear at anterior chest wall and - clear at posterior chest wall. Adventitious sounds:- No Adventitious sounds.   Cardiovascular Auscultation:Rhythm- Regular rate and rhythm. Heart Sounds- S1 WNL and S2 WNL. Murmurs & Other Heart Sounds:Auscultation of the heart reveals - No  Murmurs.   Abdomen Palpation/Percussion:Tenderness- Abdomen is non-tender to palpation. Rigidity (guarding)- Abdomen is soft. Auscultation:Auscultation of the abdomen reveals - Bowel sounds normal.   Female Genitourinary  Not done, not pertinent to present illness  Musculoskeletal  Well-developed female alert and oriented in no apparent distress.  On examination, her right hip can still be flexed to about 100 degrees with minimal internal rotation, about 20 degrees external rotation, 20 degrees abduction.  Assessment & Plan  Osteoarthritis, Hip (715.35) Impression: Right Hip  Note: Plan is for a Right Total Hip Replacement - Anterior Approach by Dr. Aluisio.  Plan is to go home.  PCP - Dr. Aaron Morrow  The patient does not have any contraindications and will receive TXA (tranexamic acid) prior to surgery.  Signed electronically by Alexzandrew L Perkins, III PA-C  

## 2013-01-24 ENCOUNTER — Encounter (HOSPITAL_COMMUNITY): Payer: Self-pay | Admitting: Orthopedic Surgery

## 2013-01-24 LAB — CBC
HCT: 34.5 % — ABNORMAL LOW (ref 36.0–46.0)
Hemoglobin: 11.5 g/dL — ABNORMAL LOW (ref 12.0–15.0)
RBC: 4.04 MIL/uL (ref 3.87–5.11)

## 2013-01-24 LAB — BASIC METABOLIC PANEL
BUN: 9 mg/dL (ref 6–23)
CO2: 25 mEq/L (ref 19–32)
Glucose, Bld: 165 mg/dL — ABNORMAL HIGH (ref 70–99)
Potassium: 3.8 mEq/L (ref 3.5–5.1)
Sodium: 138 mEq/L (ref 135–145)

## 2013-01-24 MED ORDER — OXYCODONE HCL 5 MG PO TABS: 5.0000 mg | ORAL_TABLET | ORAL | Status: DC | PRN

## 2013-01-24 MED ORDER — METHOCARBAMOL 500 MG PO TABS: 500.0000 mg | ORAL_TABLET | Freq: Four times a day (QID) | ORAL | Status: DC | PRN

## 2013-01-24 MED ORDER — RIVAROXABAN 10 MG PO TABS: 10.0000 mg | ORAL_TABLET | Freq: Every day | ORAL | Status: DC

## 2013-01-24 NOTE — Progress Notes (Signed)
   Subjective: 1 Day Post-Op Procedure(s) (LRB): RIGHT TOTAL HIP ARTHROPLASTY ANTERIOR APPROACH (Right) Patient reports pain as mild.   Patient seen in rounds with Dr. Lequita Halt. She is feeling much better today.  Pain controlled. Patient is well, and has had no acute complaints or problems We will start therapy today.  Plan is to go Home after hospital stay.  Objective: Vital signs in last 24 hours: Temp:  [98.3 F (36.8 C)-99.4 F (37.4 C)] 98.8 F (37.1 C) (09/18 0631) Pulse Rate:  [58-105] 101 (09/18 0631) Resp:  [16-21] 16 (09/18 0631) BP: (116-172)/(72-92) 131/79 mmHg (09/18 0631) SpO2:  [96 %-100 %] 96 % (09/18 0631) Weight:  [72.576 kg (160 lb)] 72.576 kg (160 lb) (09/17 1616)  Intake/Output from previous day:  Intake/Output Summary (Last 24 hours) at 01/24/13 0911 Last data filed at 01/24/13 1610  Gross per 24 hour  Intake 2533.75 ml  Output   2617 ml  Net -83.25 ml    Intake/Output this shift:    Labs:  Recent Labs  01/24/13 0455  HGB 11.5*    Recent Labs  01/24/13 0455  WBC 15.4*  RBC 4.04  HCT 34.5*  PLT 341    Recent Labs  01/24/13 0455  NA 138  K 3.8  CL 103  CO2 25  BUN 9  CREATININE 0.73  GLUCOSE 165*  CALCIUM 9.1   No results found for this basename: LABPT, INR,  in the last 72 hours  EXAM General - Patient is Alert, Appropriate and Oriented Extremity - Neurovascular intact Sensation intact distally Dorsiflexion/Plantar flexion intact Dressing - dressing C/D/I Motor Function - intact, moving foot and toes well on exam.  Hemovac pulled without difficulty.  Past Medical History  Diagnosis Date  . Hip discomfort     left  . Hypertension   . 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 Hypercholesterolemia   . Sleep apnea     USES CPAP   . History of shingles ? 2010    NO RESIDUAL PROBLEMS FROM SHINGLES  . Thyroid nodule     RIGHT--UNDER OBSERVATION-YEARLY BLOOD WORK-NO MEDICATION  . Cardiac  dysrhythmia, unspecified     pvc's Dr Anne Fu evaluated 4/14- in [facility].      Assessment/Plan: 1 Day Post-Op Procedure(s) (LRB): RIGHT TOTAL HIP ARTHROPLASTY ANTERIOR APPROACH (Right) Active Problems:   * No active hospital problems. *  Estimated body mass index is 29.26 kg/(m^2) as calculated from the following:   Height as of this encounter: 5\' 2"  (1.575 m).   Weight as of this encounter: 72.576 kg (160 lb). Advance diet Up with therapy Plan for discharge tomorrow Discharge home with home health  DVT Prophylaxis - Xarelto Weight Bearing As Tolerated right Leg Hemovac Pulled Begin Therapy No vaccines.  Jadien Lehigh 01/24/2013, 9:11 AM

## 2013-01-24 NOTE — Progress Notes (Signed)
OT Cancellation Note  Patient Details Name: Mary Munoz MRN: [id number] DOB: Nov 15, 1955   Cancelled Treatment:    Reason Eval/Treat Not Completed: OT screened, no needs identified, will sign off. Pt had opposite hip done in April of this year. Has all DME and help at home. Pt states no OT needs.  Lennox Laity [phone] 01/24/2013, 2:31 PM

## 2013-01-24 NOTE — Progress Notes (Signed)
Pt seen, already wearing cpap for rest, at previous settings of 4.5cm h2o per home regimen.  Pt is wearing her nasal pillows and tubing and tolerating cpap well at this time.

## 2013-01-24 NOTE — Care Management Note (Addendum)
    Page 1 of 1   01/25/2013     11:56:28 AM   CARE MANAGEMENT NOTE 01/25/2013  Patient:  TEMIKA, SUTPHIN   Account Number:  [id number]  Date Initiated:  01/24/2013  Documentation initiated by:  Colleen Can  Subjective/Objective Assessment:   DX RT ANTERIOR HIP REPLACEMNT    Pre-arranged with Genevieve Norlander for [facility] services which will start witin 48hrs of discharge.     Action/Plan:   CM spoke with patient. Plans are for patient to to return to her home in [place] where spouse will be caregiver. She already has RW, commode seat . Wants Genevieve Norlander for [facility] services   Anticipated DC Date:  01/25/2013   Anticipated DC Plan:  HOME W HOME HEALTH SERVICES      DC Planning Services  CM consult      [facility] Choice  HOME HEALTH   Choice offered to / List presented to:  C-1 Patient        HH arranged  HH-2 PT      [facility] agency  [facility]   Status of service:  Completed, signed off Medicare Important Message given?   (If response is "NO", the following Medicare IM given date fields will be blank) Date Medicare IM given:   Date Additional Medicare IM given:    Discharge Disposition:  HOME W HOME HEALTH SERVICES  Per UR Regulation:  Reviewed for med. necessity/level of care/duration of stay  If discussed at Long Length of Stay Meetings, dates discussed:    Comments:  01/25/2013 Colleen Can BSN RN CCM [phone] Pt discharged to home with [facility] services to start within 48hrs of discharge.

## 2013-01-24 NOTE — Evaluation (Signed)
Physical Therapy Evaluation Patient Details Name: Mary Munoz MRN: [id number] DOB: 10-Jan-1956 Today's Date: 01/24/2013 Time: 1128-1200 PT Time Calculation (min): 32 min  PT Assessment / Plan / Recommendation History of Present Illness  s/p direct anterior THA  Clinical Impression  Pt will benefit from PT in the acute setting to allow return home at maximum independence    PT Assessment  Patient needs continued PT services    Follow Up Recommendations  Home health PT    Does the patient have the potential to tolerate intense rehabilitation      Barriers to Discharge        Equipment Recommendations  None recommended by PT    Recommendations for Other Services     Frequency 7X/week    Precautions / Restrictions Precautions Precautions: None Restrictions Weight Bearing Restrictions: No Other Position/Activity Restrictions: WBAT   Pertinent Vitals/Pain No c/o      Mobility  Bed Mobility Bed Mobility: Supine to Sit Supine to Sit: 4: Min assist Transfers Transfers: Sit to Stand;Stand to Sit Sit to Stand: 4: Min guard;From bed;From toilet Stand to Sit: 4: Min guard;To toilet Details for Transfer Assistance: cues for hand placement and management of RLE for pain control Ambulation/Gait Ambulation/Gait Assistance: 4: Min guard Ambulation Distance (Feet): 25 Feet (10 more to bathroom) Assistive device: Rolling walker Ambulation/Gait Assistance Details: cues for sequence and RW safety Gait Pattern: Step-through pattern;Step-to pattern;Antalgic    Exercises Total Joint Exercises Ankle Circles/Pumps: AROM;Both;10 reps Quad Sets: AROM;10 reps;Both Heel Slides: AROM;10 reps;Right   PT Diagnosis: Difficulty walking  PT Problem List: Decreased strength;Decreased activity tolerance;Decreased balance;Decreased mobility;Decreased knowledge of use of DME PT Treatment Interventions: Functional mobility training;Gait training;DME instruction;Therapeutic  activities;Therapeutic exercise;Patient/family education     PT Goals(Current goals can be found in the care plan section) Acute Rehab PT Goals PT Goal Formulation: With patient Time For Goal Achievement: 01/28/13 Potential to Achieve Goals: Good  Visit Information  Last PT Received On: 01/24/13 Assistance Needed: +1 History of Present Illness: s/p direct anterior THA       Prior Functioning  Home Living Family/patient expects to be discharged to:: Private residence Living Arrangements: Spouse/significant other Type of Home: House Home Access: Stairs to enter Secretary/administrator of Steps: 4 Entrance Stairs-Rails: Right Home Layout: One level Home Equipment: Environmental consultant - 2 wheels;Bedside commode;Cane - single point Additional Comments: using cane due to pain in right hip Prior Function Level of Independence: Independent Communication Communication: No difficulties    Cognition  Cognition Arousal/Alertness: Awake/alert Behavior During Therapy: WFL for tasks assessed/performed Overall Cognitive Status: Within Functional Limits for tasks assessed    Extremity/Trunk Assessment     Balance    End of Session PT - End of Session Equipment Utilized During Treatment: Gait belt Activity Tolerance: Patient tolerated treatment well Patient left: in chair;with call bell/phone within reach Nurse Communication: Mobility status  GP     [facility] 01/24/2013, 12:49 PM

## 2013-01-24 NOTE — Progress Notes (Signed)
01/24/13 1600  PT Visit Information  Last PT Received On 01/24/13  Assistance Needed +1  History of Present Illness s/p direct anterior THA  PT Time Calculation  PT Start Time 1426  PT Stop Time 1449  PT Time Calculation (min) 23 min  Subjective Data  Patient Stated Goal home tomorrow  Precautions  Precautions None  Restrictions  Other Position/Activity Restrictions WBAT  Cognition  Arousal/Alertness Awake/alert  Behavior During Therapy WFL for tasks assessed/performed  Overall Cognitive Status Within Functional Limits for tasks assessed  Bed Mobility  Bed Mobility Sit to Supine  Sit to Supine 5: Supervision  Details for Bed Mobility Assistance for safety, pt able to self assist RLE with LLE  Transfers  Transfers Sit to Stand;Stand to Sit  Sit to Stand 5: Supervision;With armrests;From toilet;With upper extremity assist  Stand to Sit 5: Supervision;To toilet;With upper extremity assist;To bed  Details for Transfer Assistance cues for hand placement and management of RLE for pain control  Ambulation/Gait  Ambulation/Gait Assistance 5: Supervision  Ambulation Distance (Feet) 80 Feet  Assistive device Rolling walker  Ambulation/Gait Assistance Details cues for sequence and RW safety  Gait Pattern Step-through pattern;Step-to pattern;Antalgic  Total Joint Exercises  Ankle Circles/Pumps AROM;Both;10 reps  Quad Sets AROM;10 reps;Both  Heel Slides AROM;10 reps;Right  Hip ABduction/ADduction AROM;Both;10 reps  PT - End of Session  Equipment Utilized During Treatment Gait belt  Activity Tolerance Patient tolerated treatment well  Patient left in bed;with call bell/phone within reach  PT - Assessment/Plan  PT Plan Current plan remains appropriate  PT Frequency 7X/week  Follow Up Recommendations Home health PT  PT equipment None recommended by PT  PT Goal Progression  Progress towards PT goals Progressing toward goals  Acute Rehab PT Goals  Time For Goal Achievement 01/28/13   Potential to Achieve Goals Good  PT General Charges  $$ ACUTE PT VISIT 1 Procedure  PT Treatments  $Gait Training 8-22 mins  $Therapeutic Exercise 8-22 mins    01/24/13 1600  PT Visit Information  Last PT Received On 01/24/13  Assistance Needed +1  History of Present Illness s/p direct anterior THA  PT Time Calculation  PT Start Time 1426  PT Stop Time 1449  PT Time Calculation (min) 23 min  Subjective Data  Patient Stated Goal home tomorrow  Precautions  Precautions None  Restrictions  Other Position/Activity Restrictions WBAT  Cognition  Arousal/Alertness Awake/alert  Behavior During Therapy WFL for tasks assessed/performed  Overall Cognitive Status Within Functional Limits for tasks assessed  Bed Mobility  Bed Mobility Sit to Supine  Sit to Supine 5: Supervision  Details for Bed Mobility Assistance for safety, pt able to self assist RLE with LLE  Transfers  Transfers Sit to Stand;Stand to Sit  Sit to Stand 5: Supervision;With armrests;From toilet;With upper extremity assist  Stand to Sit 5: Supervision;To toilet;With upper extremity assist;To bed  Details for Transfer Assistance cues for hand placement and management of RLE for pain control  Ambulation/Gait  Ambulation/Gait Assistance 5: Supervision  Ambulation Distance (Feet) 80 Feet  Assistive device Rolling walker  Ambulation/Gait Assistance Details cues for sequence and RW safety  Gait Pattern Step-through pattern;Step-to pattern;Antalgic  Total Joint Exercises  Ankle Circles/Pumps AROM;Both;10 reps  Quad Sets AROM;10 reps;Both  Heel Slides AROM;10 reps;Right  Hip ABduction/ADduction AROM;Both;10 reps  PT - End of Session  Equipment Utilized During Treatment Gait belt  Activity Tolerance Patient tolerated treatment well  Patient left in bed;with call bell/phone within reach  PT - Assessment/Plan  PT Plan Current plan remains appropriate  PT Frequency 7X/week  Follow Up Recommendations Home health PT  PT  equipment None recommended by PT  PT Goal Progression  Progress towards PT goals Progressing toward goals  Acute Rehab PT Goals  Time For Goal Achievement 01/28/13  Potential to Achieve Goals Good  PT General Charges  $$ ACUTE PT VISIT 1 Procedure  PT Treatments  $Gait Training 8-22 mins  $Therapeutic Exercise 8-22 mins

## 2013-01-24 NOTE — Progress Notes (Signed)
Utilization review completed.  

## 2013-01-25 LAB — BASIC METABOLIC PANEL
BUN: 12 mg/dL (ref 6–23)
CO2: 24 mEq/L (ref 19–32)
Calcium: 9.4 mg/dL (ref 8.4–10.5)
Creatinine, Ser: 0.59 mg/dL (ref 0.50–1.10)
Glucose, Bld: 135 mg/dL — ABNORMAL HIGH (ref 70–99)

## 2013-01-25 LAB — CBC
HCT: 33.8 % — ABNORMAL LOW (ref 36.0–46.0)
Hemoglobin: 11.2 g/dL — ABNORMAL LOW (ref 12.0–15.0)
MCH: 28.6 pg (ref 26.0–34.0)
MCV: 86.2 fL (ref 78.0–100.0)
RBC: 3.92 MIL/uL (ref 3.87–5.11)

## 2013-01-25 MED ORDER — LISINOPRIL 10 MG PO TABS
10.0000 mg | ORAL_TABLET | Freq: Every day | ORAL | Status: DC
  Administered 2013-01-25: 10 mg via ORAL
  Filled 2013-01-25: qty 1

## 2013-01-25 MED ORDER — LISINOPRIL-HYDROCHLOROTHIAZIDE 10-12.5 MG PO TABS: 1.0000 | ORAL_TABLET | Freq: Every morning | ORAL | Status: DC

## 2013-01-25 MED ORDER — HYDROCHLOROTHIAZIDE 12.5 MG PO CAPS
12.5000 mg | ORAL_CAPSULE | Freq: Every day | ORAL | Status: DC
  Administered 2013-01-25: 12.5 mg via ORAL
  Filled 2013-01-25: qty 1

## 2013-01-25 NOTE — Progress Notes (Signed)
   Subjective: 2 Days Post-Op Procedure(s) (LRB): RIGHT TOTAL HIP ARTHROPLASTY ANTERIOR APPROACH (Right) Patient reports pain as mild.   Patient seen in rounds with Dr. Lequita Halt. Patient is well, and has had no acute complaints or problems Patient is ready to go home  Objective: Vital signs in last 24 hours: Temp:  [98.3 F (36.8 C)-99.1 F (37.3 C)] 98.3 F (36.8 C) (09/19 0422) Pulse Rate:  [77-90] 77 (09/19 0422) Resp:  [16-18] 16 (09/19 0422) BP: (131-164)/(76-91) 154/87 mmHg (09/19 0422) SpO2:  [90 %-99 %] 99 % (09/19 0422)  Intake/Output from previous day:  Intake/Output Summary (Last 24 hours) at 01/25/13 0830 Last data filed at 01/25/13 0724  Gross per 24 hour  Intake   2070 ml  Output   2650 ml  Net   -580 ml    Intake/Output this shift: Total I/O In: 240 [P.O.:240] Out: 300 [Urine:300]  Labs:  Recent Labs  01/24/13 0455 01/25/13 0415  HGB 11.5* 11.2*    Recent Labs  01/24/13 0455 01/25/13 0415  WBC 15.4* 17.1*  RBC 4.04 3.92  HCT 34.5* 33.8*  PLT 341 288    Recent Labs  01/24/13 0455 01/25/13 0415  NA 138 137  K 3.8 4.0  CL 103 103  CO2 25 24  BUN 9 12  CREATININE 0.73 0.59  GLUCOSE 165* 135*  CALCIUM 9.1 9.4   No results found for this basename: LABPT, INR,  in the last 72 hours  EXAM: General - Patient is Alert, Appropriate and Oriented Extremity - Neurovascular intact Sensation intact distally Dorsiflexion/Plantar flexion intact Incision - clean, dry, no drainage, healing Motor Function - intact, moving foot and toes well on exam.   Assessment/Plan: 2 Days Post-Op Procedure(s) (LRB): RIGHT TOTAL HIP ARTHROPLASTY ANTERIOR APPROACH (Right) Procedure(s) (LRB): RIGHT TOTAL HIP ARTHROPLASTY ANTERIOR APPROACH (Right) Past Medical History  Diagnosis Date  . Hip discomfort     left  . Hypertension   . 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 Hypercholesterolemia   . Sleep apnea     USES CPAP   .  History of shingles ? 2010    NO RESIDUAL PROBLEMS FROM SHINGLES  . Thyroid nodule     RIGHT--UNDER OBSERVATION-YEARLY BLOOD WORK-NO MEDICATION  . Cardiac dysrhythmia, unspecified     pvc's Dr Anne Fu evaluated 4/14- in [facility].     Active Problems:   * No active hospital problems. *  Estimated body mass index is 29.26 kg/(m^2) as calculated from the following:   Height as of this encounter: 5\' 2"  (1.575 m).   Weight as of this encounter: 72.576 kg (160 lb). Up with therapy Discharge home with home health Diet - Cardiac diet Follow up - in 2 weeks Activity - WBAT Disposition - Home Condition Upon Discharge - Good D/C Meds - See DC Summary DVT Prophylaxis - Xarelto  Dayna Geurts 01/25/2013, 8:30 AM

## 2013-01-25 NOTE — Progress Notes (Signed)
Physical Therapy Treatment Patient Details Name: Mary Munoz MRN: [id number] DOB: June 29, 1955 Today's Date: 01/25/2013 Time: 4098-1191 PT Time Calculation (min): 30 min  PT Assessment / Plan / Recommendation  History of Present Illness s/p direct anterior THA   PT Comments   Practiced steps, performed TE's and instructed spouse on safe handling tech.  Pt plans to D/C to home today.  Follow Up Recommendations  Home health PT     Does the patient have the potential to tolerate intense rehabilitation     Barriers to Discharge        Equipment Recommendations  None recommended by PT    Recommendations for Other Services    Frequency     Progress towards PT Goals Progress towards PT goals: Progressing toward goals  Plan      Precautions / Restrictions Precautions Precautions: None Restrictions Weight Bearing Restrictions: No Other Position/Activity Restrictions: WBAT    Pertinent Vitals/Pain C/o "soreness" ICE applied      Mobility  Bed Mobility Bed Mobility: Not assessed Details for Bed Mobility Assistance: Pt OOB in recliner Transfers Transfers: Sit to Stand;Stand to Sit Sit to Stand: 6: Modified independent (Device/Increase time);5: Supervision;From chair/3-in-1 Stand to Sit: 5: Supervision;6: Modified independent (Device/Increase time);To chair/3-in-1 Details for Transfer Assistance: increased time Ambulation/Gait Ambulation/Gait Assistance: 5: Supervision;6: Modified independent (Device/Increase time) Ambulation Distance (Feet): 300 Feet Assistive device: Rolling walker Ambulation/Gait Assistance Details: increased time and one VC safety with backward gait Gait Pattern: Step-through pattern;Step-to pattern;Antalgic Gait velocity: WFL Stairs: Yes Stairs Assistance: 5: Supervision Stairs Assistance Details (indicate cue type and reason): with spouse present and only one initial VC on proper sequencing Stair Management Technique: Two  rails;Forwards Number of Stairs: 2    Exercises   Total Hip Replacement TE's 10 reps ankle pumps 10 reps knee presses 10 reps heel slides 10 reps SAQ's 10 reps ABD Followed by ICE    PT Goals (current goals can now be found in the care plan section)    Visit Information  Last PT Received On: 01/25/13 Assistance Needed: +1 History of Present Illness: s/p direct anterior THA    Subjective Data      Cognition       Balance     End of Session PT - End of Session Equipment Utilized During Treatment: Gait belt Activity Tolerance: Patient tolerated treatment well Patient left: in chair;with call bell/phone within reach;with family/visitor present   Felecia Shelling  PTA [facility]  Acute  Rehab Pager      [phone]

## 2013-02-07 NOTE — Discharge Summary (Signed)
Physician Discharge Summary   Patient ID: TYREA Munoz MRN: [id number] DOB/AGE: 59-Apr-1957 59 y.o.  Admit date: 01/23/2013 Discharge date: 01/25/2013  Primary Diagnosis: Osteoarthritis of the Right hip.   Admission Diagnoses:  Past Medical History  Diagnosis Date  . Hip discomfort     left  . Hypertension   . 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 Hypercholesterolemia   . Sleep apnea     USES CPAP   . History of shingles ? 2010    NO RESIDUAL PROBLEMS FROM SHINGLES  . Thyroid nodule     RIGHT--UNDER OBSERVATION-YEARLY BLOOD WORK-NO MEDICATION  . Cardiac dysrhythmia, unspecified     pvc's Dr Anne Fu evaluated 4/14- in [facility].     Discharge Diagnoses:   Active Problems:   * No active hospital problems. *  Estimated body mass index is 29.26 kg/(m^2) as calculated from the following:   Height as of this encounter: 5\' 2"  (1.575 m).   Weight as of this encounter: 72.576 kg (160 lb).  Procedure(s) (LRB): RIGHT TOTAL HIP ARTHROPLASTY ANTERIOR APPROACH (Right)   Consults: None  HPI: Mary Munoz is a 57 y.o. female who has advanced end-  stage arthritis of his Right hip with progressively worsening pain and  dysfunction.The patient has failed nonoperative management and presents for  total hip arthroplasty.   Laboratory Data: Admission on 01/23/2013, Discharged on 01/25/2013  Component Date Value Range Status  . WBC 01/24/2013 15.4* 4.0 - 10.5 K/uL Final  . RBC 01/24/2013 4.04  3.87 - 5.11 MIL/uL Final  . Hemoglobin 01/24/2013 11.5* 12.0 - 15.0 g/dL Final  . HCT 69/62/9528 34.5* 36.0 - 46.0 % Final  . MCV 01/24/2013 85.4  78.0 - 100.0 fL Final  . MCH 01/24/2013 28.5  26.0 - 34.0 pg Final  . MCHC 01/24/2013 33.3  30.0 - 36.0 g/dL Final  . RDW 41/32/4401 15.1  11.5 - 15.5 % Final  . Platelets 01/24/2013 341  150 - 400 K/uL Final  . Sodium 01/24/2013 138  135 - 145 mEq/L Final  . Potassium 01/24/2013 3.8  3.5 - 5.1 mEq/L Final  .  Chloride 01/24/2013 103  96 - 112 mEq/L Final  . CO2 01/24/2013 25  19 - 32 mEq/L Final  . Glucose, Bld 01/24/2013 165* 70 - 99 mg/dL Final  . BUN 02/72/5366 9  6 - 23 mg/dL Final  . Creatinine, Ser 01/24/2013 0.73  0.50 - 1.10 mg/dL Final  . Calcium 44/07/4740 9.1  8.4 - 10.5 mg/dL Final  . GFR calc non Af Amer 01/24/2013 >90  >90 mL/min Final  . GFR calc Af Amer 01/24/2013 >90  >90 mL/min Final   Comment: (NOTE)                          The eGFR has been calculated using the CKD EPI equation.                          This calculation has not been validated in all clinical situations.                          eGFR's persistently <90 mL/min signify possible Chronic Kidney                          Disease.  . WBC 01/25/2013 17.1*  4.0 - 10.5 K/uL Final  . RBC 01/25/2013 3.92  3.87 - 5.11 MIL/uL Final  . Hemoglobin 01/25/2013 11.2* 12.0 - 15.0 g/dL Final  . HCT 29/56/2130 33.8* 36.0 - 46.0 % Final  . MCV 01/25/2013 86.2  78.0 - 100.0 fL Final  . MCH 01/25/2013 28.6  26.0 - 34.0 pg Final  . MCHC 01/25/2013 33.1  30.0 - 36.0 g/dL Final  . RDW 86/57/8469 15.3  11.5 - 15.5 % Final  . Platelets 01/25/2013 288  150 - 400 K/uL Final  . Sodium 01/25/2013 137  135 - 145 mEq/L Final  . Potassium 01/25/2013 4.0  3.5 - 5.1 mEq/L Final  . Chloride 01/25/2013 103  96 - 112 mEq/L Final  . CO2 01/25/2013 24  19 - 32 mEq/L Final  . Glucose, Bld 01/25/2013 135* 70 - 99 mg/dL Final  . BUN 62/95/2841 12  6 - 23 mg/dL Final  . Creatinine, Ser 01/25/2013 0.59  0.50 - 1.10 mg/dL Final  . Calcium 32/44/0102 9.4  8.4 - 10.5 mg/dL Final  . GFR calc non Af Amer 01/25/2013 >90  >90 mL/min Final  . GFR calc Af Amer 01/25/2013 >90  >90 mL/min Final   Comment: (NOTE)                          The eGFR has been calculated using the CKD EPI equation.                          This calculation has not been validated in all clinical situations.                          eGFR's persistently <90 mL/min signify possible  Chronic Kidney                          Disease.  Hospital Outpatient Visit on 01/17/2013  Component Date Value Range Status  . aPTT 01/17/2013 28  24 - 37 seconds Final  . WBC 01/17/2013 7.1  4.0 - 10.5 K/uL Final  . RBC 01/17/2013 4.77  3.87 - 5.11 MIL/uL Final  . Hemoglobin 01/17/2013 13.5  12.0 - 15.0 g/dL Final  . HCT 72/53/6644 40.7  36.0 - 46.0 % Final  . MCV 01/17/2013 85.3  78.0 - 100.0 fL Final  . MCH 01/17/2013 28.3  26.0 - 34.0 pg Final  . MCHC 01/17/2013 33.2  30.0 - 36.0 g/dL Final  . RDW 03/47/4259 15.4  11.5 - 15.5 % Final  . Platelets 01/17/2013 366  150 - 400 K/uL Final  . Sodium 01/17/2013 136  135 - 145 mEq/L Final  . Potassium 01/17/2013 4.1  3.5 - 5.1 mEq/L Final  . Chloride 01/17/2013 100  96 - 112 mEq/L Final  . CO2 01/17/2013 28  19 - 32 mEq/L Final  . Glucose, Bld 01/17/2013 89  70 - 99 mg/dL Final  . BUN 56/38/7564 13  6 - 23 mg/dL Final  . Creatinine, Ser 01/17/2013 0.77  0.50 - 1.10 mg/dL Final  . Calcium 33/29/5188 9.8  8.4 - 10.5 mg/dL Final  . Total Protein 01/17/2013 7.4  6.0 - 8.3 g/dL Final  . Albumin 41/66/0630 3.9  3.5 - 5.2 g/dL Final  . AST 16/05/930 21  0 - 37 U/L Final  . ALT 01/17/2013 18  0 - 35 U/L Final  . Alkaline Phosphatase  01/17/2013 125* 39 - 117 U/L Final  . Total Bilirubin 01/17/2013 0.3  0.3 - 1.2 mg/dL Final  . GFR calc non Af Amer 01/17/2013 >90  >90 mL/min Final  . GFR calc Af Amer 01/17/2013 >90  >90 mL/min Final   Comment: (NOTE)                          The eGFR has been calculated using the CKD EPI equation.                          This calculation has not been validated in all clinical situations.                          eGFR's persistently <90 mL/min signify possible Chronic Kidney                          Disease.  Marland Kitchen Prothrombin Time 01/17/2013 13.0  11.6 - 15.2 seconds Final  . INR 01/17/2013 1.00  0.00 - 1.49 Final  . ABO/RH(D) 01/17/2013 O POS   Final  . Antibody Screen 01/17/2013 NEG   Final  . Sample  Expiration 01/17/2013 01/26/2013   Final  . Color, Urine 01/17/2013 YELLOW  YELLOW Final  . APPearance 01/17/2013 CLOUDY* CLEAR Final  . Specific Gravity, Urine 01/17/2013 1.031* 1.005 - 1.030 Final  . pH 01/17/2013 5.0  5.0 - 8.0 Final  . Glucose, UA 01/17/2013 NEGATIVE  NEGATIVE mg/dL Final  . Hgb urine dipstick 01/17/2013 NEGATIVE  NEGATIVE Final  . Bilirubin Urine 01/17/2013 SMALL* NEGATIVE Final  . Ketones, ur 01/17/2013 NEGATIVE  NEGATIVE mg/dL Final  . Protein, ur 62/95/2841 NEGATIVE  NEGATIVE mg/dL Final  . Urobilinogen, UA 01/17/2013 0.2  0.0 - 1.0 mg/dL Final  . Nitrite 32/44/0102 NEGATIVE  NEGATIVE Final  . Leukocytes, UA 01/17/2013 NEGATIVE  NEGATIVE Final   MICROSCOPIC NOT DONE ON URINES WITH NEGATIVE PROTEIN, BLOOD, LEUKOCYTES, NITRITE, OR GLUCOSE <1000 mg/dL.  Marland Kitchen MRSA, PCR 01/17/2013 NEGATIVE  NEGATIVE Final  . Staphylococcus aureus 01/17/2013 NEGATIVE  NEGATIVE Final   Comment:                                 The Xpert SA Assay (FDA                          approved for NASAL specimens                          in patients over 48 years of age),                          is one component of                          a comprehensive surveillance                          program.  Test performance has                          been validated by [employer]  [facility] for patients greater                          than or equal to 25 year old.                          It is not intended                          to diagnose infection nor to                          guide or monitor treatment.     X-Rays:Dg Hip Complete Right  01/17/2013   CLINICAL DATA:  Preop for right hip replacement  EXAM: RIGHT HIP - COMPLETE 2+ VIEW  COMPARISON:  Report 09/29/1999 no images available  FINDINGS: Three views of the right hip submitted. No acute fracture or subluxation. There is a left hip prosthesis in anatomic alignment. Osteoarthritic changes are noted with significant  narrowing of the superior joint space. Mild remodeling of superior aspect of right femoral head Mild sclerosis of right superior acetabulum. There is spurring of greater right femoral trochanter. Well corticated small calcification adjacent to right superior acetabulum may represent a fragmented osteophyte or tendinous calcification.  IMPRESSION: No acute fracture or subluxation. Osteoarthritic changes as described above. Left hip prosthesis in anatomic alignment.   Electronically Signed   By: Natasha Mead   On: 01/17/2013 14:13   Dg Pelvis Portable  01/23/2013   CLINICAL DATA:  Postop right hip replacement  EXAM: PORTABLE PELVIS  COMPARISON:  08/08/2012  FINDINGS: Satisfactory right hip replacement. Normal alignment. No fracture and no immediate complication.  Left hip replacement also appears satisfactory.  IMPRESSION: Satisfactory right hip replacement.   Electronically Signed   By: Marlan Palau M.D.   On: 01/23/2013 13:53   Dg C-arm 61-120 Min-no Report  01/23/2013   CLINICAL DATA: right anterior hip   C-ARM 61-120 MINUTES  Fluoroscopy was utilized by the requesting physician.  No radiographic  interpretation.     EKG: Orders placed during the hospital encounter of 08/08/12  . EKG 12-LEAD  . EKG 12-LEAD  . EKG 12-LEAD  . EKG 12-LEAD  . EKG  . EKG     Hospital Course: Patient was admitted to [facility] and taken to the OR and underwent the above state procedure without complications.  Patient tolerated the procedure well and was later transferred to the recovery room and then to the orthopaedic floor for postoperative care.  They were given PO and IV analgesics for pain control following their surgery.  They were given 24 hours of postoperative antibiotics of  Anti-infectives   Start     Dose/Rate Route Frequency Ordered Stop   01/23/13 1700  ceFAZolin (ANCEF) IVPB 1 g/50 mL premix     1 g 100 mL/hr over 30 Minutes Intravenous Every 6 hours 01/23/13 1429 01/23/13 2232   01/23/13  0830  ceFAZolin (ANCEF) IVPB 2 g/50 mL premix     2 g 100 mL/hr over 30 Minutes Intravenous On call to O.R. 01/23/13 0820 01/23/13 1045     and started on DVT prophylaxis in the form of Xarelto.   PT and OT were ordered for total hip protocol.  The patient was allowed to be WBAT with therapy. Discharge planning was consulted to help with postop disposition  and equipment needs.  Patient had a good night on the evening of surgery.  They started to get up OOB with therapy on day one.  Hemovac drain was pulled without difficulty. Continued to work with therapy into day two.  Dressing was changed on day two and the incision was healing well.  Patient was seen in rounds and was ready to go home.   Discharge Medications: Prior to Admission medications   Medication Sig Start Date End Date Taking? Authorizing Provider  lisinopril-hydrochlorothiazide (PRINZIDE,ZESTORETIC) 10-12.5 MG per tablet Take 1 tablet by mouth every morning.    Yes Historical Provider, MD  methocarbamol (ROBAXIN) 500 MG tablet Take 1 tablet (500 mg total) by mouth every 6 (six) hours as needed. 01/24/13   Alexzandrew Perkins, PA-C  oxyCODONE (OXY IR/ROXICODONE) 5 MG immediate release tablet Take 1-2 tablets (5-10 mg total) by mouth every 3 (three) hours as needed. 01/24/13   Alexzandrew Julien Girt, PA-C  rivaroxaban (XARELTO) 10 MG TABS tablet Take 1 tablet (10 mg total) by mouth daily with breakfast. Take Xarelto for two and a half more weeks, then discontinue Xarelto. Once the patient has completed the blood thinner regimen, then take a Baby 81 mg Aspirin daily for four more weeks. 01/24/13   Alexzandrew Perkins, PA-C  traMADol (ULTRAM) 50 MG tablet Take 100 mg by mouth at bedtime.    Historical Provider, MD   Discharge home with home health  Diet - Cardiac diet  Follow up - in 2 weeks  Activity - WBAT  Disposition - Home  Condition Upon Discharge - Good  D/C Meds - See DC Summary  DVT Prophylaxis - Xarelto       Discharge  Orders   Future Orders Complete By Expires   Call MD / Call 911  As directed    Comments:     If you experience chest pain or shortness of breath, CALL 911 and be transported to the hospital emergency room.  If you develope a fever above 101 F, pus (white drainage) or increased drainage or redness at the wound, or calf pain, call your surgeon's office.   Change dressing  As directed    Comments:     You may change your dressing dressing daily with sterile 4 x 4 inch gauze dressing and paper tape.  Do not submerge the incision under water.   Constipation Prevention  As directed    Comments:     Drink plenty of fluids.  Prune juice may be helpful.  You may use a stool softener, such as Colace (over the counter) 100 mg twice a day.  Use MiraLax (over the counter) for constipation as needed.   Diet general  As directed    Discharge instructions  As directed    Comments:     Pick up stool softner and laxative for home. Do not submerge incision under water. May shower. Continue to use ice for pain and swelling from surgery.  Total Hip Protocol.  Take Xarelto for two and a half more weeks, then discontinue Xarelto. Once the patient has completed the blood thinner regimen, then take a Baby 81 mg Aspirin daily for four more weeks.   Do not sit on low chairs, stoools or toilet seats, as it may be difficult to get up from low surfaces  As directed    Driving restrictions  As directed    Comments:     No driving until released by the physician.   Follow the hip precautions as taught  in Physical Therapy  As directed    Increase activity slowly as tolerated  As directed    Lifting restrictions  As directed    Comments:     No lifting until released by the physician.   Patient may shower  As directed    Comments:     You may shower without a dressing once there is no drainage.  Do not wash over the wound.  If drainage remains, do not shower until drainage stops.   TED hose  As directed     Comments:     Use stockings (TED hose) for 3 weeks on both leg(s).  You may remove them at night for sleeping.   Weight bearing as tolerated  As directed        Medication List    STOP taking these medications       HYDROcodone-acetaminophen 5-325 MG per tablet  Commonly known as:  NORCO/VICODIN     ibuprofen 200 MG tablet  Commonly known as:  ADVIL,MOTRIN      TAKE these medications       lisinopril-hydrochlorothiazide 10-12.5 MG per tablet  Commonly known as:  PRINZIDE,ZESTORETIC  Take 1 tablet by mouth every morning.     methocarbamol 500 MG tablet  Commonly known as:  ROBAXIN  Take 1 tablet (500 mg total) by mouth every 6 (six) hours as needed.     oxyCODONE 5 MG immediate release tablet  Commonly known as:  Oxy IR/ROXICODONE  Take 1-2 tablets (5-10 mg total) by mouth every 3 (three) hours as needed.     rivaroxaban 10 MG Tabs tablet  Commonly known as:  XARELTO  - Take 1 tablet (10 mg total) by mouth daily with breakfast. Take Xarelto for two and a half more weeks, then discontinue Xarelto.  - Once the patient has completed the blood thinner regimen, then take a Baby 81 mg Aspirin daily for four more weeks.     traMADol 50 MG tablet  Commonly known as:  ULTRAM  Take 100 mg by mouth at bedtime.       Follow-up Information   Follow up with Loanne Drilling, MD. Schedule an appointment as soon as possible for a visit in 2 weeks.   Specialty:  Orthopedic Surgery   Contact information:   [address] [place] [address] [phone]       Signed: Patrica Duel 02/07/2013, 9:36 AM

## 2013-06-15 ENCOUNTER — Encounter: Payer: Self-pay | Admitting: *Deleted

## 2013-07-12 DIAGNOSIS — L989 Disorder of the skin and subcutaneous tissue, unspecified: Secondary | ICD-10-CM | POA: Insufficient documentation

## 2013-07-23 DIAGNOSIS — L821 Other seborrheic keratosis: Secondary | ICD-10-CM | POA: Insufficient documentation

## 2013-09-26 ENCOUNTER — Other Ambulatory Visit: Payer: Self-pay | Admitting: Family Medicine

## 2013-09-26 DIAGNOSIS — Z1231 Encounter for screening mammogram for malignant neoplasm of breast: Secondary | ICD-10-CM

## 2013-10-17 ENCOUNTER — Ambulatory Visit
Admission: RE | Admit: 2013-10-17 | Discharge: 2013-10-17 | Disposition: A | Discharge status: Home / Self Care | Payer: BC Managed Care – PPO | Source: Ambulatory Visit | Attending: Family Medicine | Admitting: Family Medicine

## 2013-10-17 ENCOUNTER — Encounter (INDEPENDENT_AMBULATORY_CARE_PROVIDER_SITE_OTHER): Payer: Self-pay

## 2013-10-17 ENCOUNTER — Other Ambulatory Visit: Payer: Self-pay | Admitting: Family Medicine

## 2013-10-17 DIAGNOSIS — Z1231 Encounter for screening mammogram for malignant neoplasm of breast: Secondary | ICD-10-CM

## 2013-10-31 ENCOUNTER — Other Ambulatory Visit: Payer: Self-pay | Admitting: Family Medicine

## 2013-10-31 DIAGNOSIS — E049 Nontoxic goiter, unspecified: Secondary | ICD-10-CM

## 2013-11-04 ENCOUNTER — Other Ambulatory Visit: Payer: BC Managed Care – PPO

## 2013-11-05 ENCOUNTER — Other Ambulatory Visit: Payer: BC Managed Care – PPO

## 2014-05-20 ENCOUNTER — Other Ambulatory Visit: Payer: Self-pay | Admitting: Family Medicine

## 2014-05-20 DIAGNOSIS — E049 Nontoxic goiter, unspecified: Secondary | ICD-10-CM

## 2014-05-22 ENCOUNTER — Ambulatory Visit
Admission: RE | Admit: 2014-05-22 | Discharge: 2014-05-22 | Disposition: A | Discharge status: Home / Self Care | Payer: BLUE CROSS/BLUE SHIELD | Source: Ambulatory Visit | Attending: Family Medicine | Admitting: Family Medicine

## 2014-05-22 DIAGNOSIS — E049 Nontoxic goiter, unspecified: Secondary | ICD-10-CM

## 2014-05-23 ENCOUNTER — Other Ambulatory Visit: Payer: Self-pay | Admitting: Family Medicine

## 2014-05-23 DIAGNOSIS — E041 Nontoxic single thyroid nodule: Secondary | ICD-10-CM

## 2014-06-11 ENCOUNTER — Ambulatory Visit
Admission: RE | Admit: 2014-06-11 | Discharge: 2014-06-11 | Disposition: A | Discharge status: Home / Self Care | Payer: BLUE CROSS/BLUE SHIELD | Source: Ambulatory Visit | Attending: Family Medicine | Admitting: Family Medicine

## 2014-06-11 ENCOUNTER — Other Ambulatory Visit (HOSPITAL_COMMUNITY)
Admission: RE | Admit: 2014-06-11 | Discharge: 2014-06-11 | Disposition: A | Discharge status: Home / Self Care | Payer: BLUE CROSS/BLUE SHIELD | Source: Ambulatory Visit | Attending: Interventional Radiology | Admitting: Interventional Radiology

## 2014-06-11 DIAGNOSIS — E041 Nontoxic single thyroid nodule: Secondary | ICD-10-CM | POA: Insufficient documentation

## 2014-06-26 ENCOUNTER — Ambulatory Visit (INDEPENDENT_AMBULATORY_CARE_PROVIDER_SITE_OTHER): Payer: Self-pay | Admitting: General Surgery

## 2014-06-26 NOTE — H&P (Signed)
History of Present Illness Ralene Ok MD; 06/26/2014 9:58 AM) Patient words: [facility].  The patient is a 59 year old female who presents with an inguinal hernia. The patient is an 59 year old female who previously seen secondary to right inguinal hernia. Patient was asymptomatic at that time. Patient was recently seen in the ER secondary to right inguinal pain. This was reduced in the emergency room. He does state that the hernia is reducible still at this time. The patient is concerned that he will be traveling this summer and is not on any incidents with his hernia would like to have it repaired.   Allergies (Sonya Bynum, CMA; 06/26/2014 9:42 AM) No Known Drug Allergies09/15/2015  Medication History (Sonya Bynum, CMA; 06/26/2014 9:42 AM) Allopurinol (300MG  Tablet, Oral daily) Active. AmLODIPine Besylate (5MG  Tablet, Oral daily) Active. Colcrys (0.6MG  Tablet, Oral daily) Active. Amaryl (1MG  Tablet, Oral daily) Active. Hydrochlorothiazide (25MG  Tablet, Oral daily) Active. Lisinopril (40MG  Tablet, Oral daily) Active. Synthroid (112MCG Tablet, Oral daily) Active. Flomax (0.4MG  Capsule ER 24HR, Oral daily) Active. Toprol XL (50MG  Tablet ER 24HR, Oral daily) Active.  Vitals (Sonya Bynum CMA; 06/26/2014 9:42 AM) 06/26/2014 9:42 AM Weight: 149 lb Height: 66in Body Surface Area: 1.77 m Body Mass Index: 24.05 kg/m Temp.: 59F(Temporal)  Pulse: 73 (Regular)  BP: 134/76 (Sitting, Left Arm, Standard)    Physical Exam Ralene Ok MD; 06/26/2014 9:57 AM) General Mental Status-Alert. General Appearance-Consistent with stated age. Hydration-Well hydrated. Voice-Normal.  Head and Neck Head-normocephalic, atraumatic with no lesions or palpable masses. Trachea-midline. Thyroid Gland Characteristics - normal size and consistency.  Chest and Lung Exam Chest and lung exam reveals -quiet, even and easy respiratory effort with no use of accessory muscles and  on auscultation, normal breath sounds, no adventitious sounds and normal vocal resonance. Inspection Chest Wall - Normal. Back - normal.  Cardiovascular Cardiovascular examination reveals -normal heart sounds, regular rate and rhythm with no murmurs and normal pedal pulses bilaterally.  Abdomen Inspection Skin - Scar - no surgical scars. Hernias - Inguinal hernia - Right - Reducible. Palpation/Percussion Normal exam - Soft, Non Tender, No Rebound tenderness, No Rigidity (guarding) and No hepatosplenomegaly. Auscultation Normal exam - Bowel sounds normal.    Assessment & Plan Ralene Ok MD; 06/26/2014 9:59 AM) RIGHT INGUINAL HERNIA (550.90  K40.90) Impression: 59 year old female with a right inguinal hernia, reducible  1. The patient will like to proceed to the operating room for laparoscopic right inguinal hernia repair with mesh. 2. All risks and benefits were discussed with the patient to generally include, but not limited to: infection, bleeding, damage to surrounding structures, acute and chronic nerve pain, and recurrence. Alternatives were offered and described. All questions were answered and the patient voiced understanding of the procedure and wishes to proceed at this point with hernia repair.

## 2014-08-02 ENCOUNTER — Encounter (HOSPITAL_COMMUNITY): Payer: Self-pay

## 2014-08-05 ENCOUNTER — Encounter (HOSPITAL_COMMUNITY): Payer: Self-pay

## 2014-08-06 ENCOUNTER — Ambulatory Visit (HOSPITAL_COMMUNITY)
Admission: RE | Admit: 2014-08-06 | Discharge: 2014-08-06 | Disposition: A | Discharge status: Home / Self Care | Payer: BLUE CROSS/BLUE SHIELD | Source: Ambulatory Visit | Attending: Anesthesiology | Admitting: Anesthesiology

## 2014-08-06 ENCOUNTER — Encounter (HOSPITAL_COMMUNITY)
Admission: RE | Admit: 2014-08-06 | Discharge: 2014-08-06 | Disposition: A | Discharge status: Home / Self Care | Payer: BLUE CROSS/BLUE SHIELD | Source: Ambulatory Visit | Attending: General Surgery | Admitting: General Surgery

## 2014-08-06 ENCOUNTER — Encounter (HOSPITAL_COMMUNITY): Payer: Self-pay

## 2014-08-06 DIAGNOSIS — Z01818 Encounter for other preprocedural examination: Secondary | ICD-10-CM | POA: Insufficient documentation

## 2014-08-06 LAB — CBC
HEMATOCRIT: 40.7 % (ref 36.0–46.0)
Hemoglobin: 13.1 g/dL (ref 12.0–15.0)
MCH: 28 pg (ref 26.0–34.0)
MCHC: 32.2 g/dL (ref 30.0–36.0)
MCV: 87 fL (ref 78.0–100.0)
PLATELETS: 303 10*3/uL (ref 150–400)
RBC: 4.68 MIL/uL (ref 3.87–5.11)
RDW: 15.5 % (ref 11.5–15.5)
WBC: 7.3 10*3/uL (ref 4.0–10.5)

## 2014-08-06 LAB — BASIC METABOLIC PANEL
Anion gap: 7 (ref 5–15)
BUN: 13 mg/dL (ref 6–23)
CO2: 27 mmol/L (ref 19–32)
Calcium: 9.3 mg/dL (ref 8.4–10.5)
Chloride: 106 mmol/L (ref 96–112)
Creatinine, Ser: 0.8 mg/dL (ref 0.50–1.10)
GFR calc non Af Amer: 79 mL/min — ABNORMAL LOW (ref >90)
GLUCOSE: 92 mg/dL (ref 70–99)
Potassium: 4.4 mmol/L (ref 3.5–5.1)
Sodium: 140 mmol/L (ref 135–145)

## 2014-08-06 NOTE — Patient Instructions (Signed)
Mary Munoz  08/06/2014   Your procedure is scheduled on: 08/13/14   Report to [facility] Main  Entrance and follow signs to               [place] at 6:30 AM.   Call this number if you have problems the morning of surgery [phone]   Remember:  Do not eat food or drink liquids :After Midnight.    Take these medicines the morning of surgery with A SIP OF WATER: NONE             BRING C PAP MASK AND TUBING TO HOSPITAL                               You may not have any metal on your body including hair pins and              piercings  Do not wear jewelry, make-up, lotions, powders or perfumes.             Do not wear nail polish.  Do not shave  48 hours prior to surgery.              Men may shave face and neck.   Do not bring valuables to the hospital. [place].  Contacts, dentures or bridgework may not be worn into surgery.  Leave suitcase in the car. After surgery it may be brought to your room.     Patients discharged the day of surgery will not be allowed to drive home.  Name and phone number of your driver:  Special Instructions: N/A              Please read over the following fact sheets you were given: _____________________________________________________________________                                                     [place]  Before surgery, you can play an important role.  Because skin is not sterile, your skin needs to be as free of germs as possible.  You can reduce the number of germs on your skin by washing with CHG (chlorahexidine gluconate) soap before surgery.  CHG is an antiseptic cleaner which kills germs and bonds with the skin to continue killing germs even after washing. Please DO NOT use if you have an allergy to CHG or antibacterial soaps.  If your skin becomes reddened/irritated stop using the CHG and inform your nurse when you  arrive at Short Stay. Do not shave (including legs and underarms) for at least 48 hours prior to the first CHG shower.  You may shave your face. Please follow these instructions carefully:   1.  Shower with CHG Soap the night before surgery and the  morning of Surgery.   2.  If you choose to wash your hair, wash your hair first as usual with your  normal  Shampoo.   3.  After you shampoo, rinse your hair and body thoroughly to remove the  shampoo.  4.  Use CHG as you would any other liquid soap.  You can apply chg directly  to the skin and wash . Gently wash with scrungie or clean wascloth    5.  Apply the CHG Soap to your body ONLY FROM THE NECK DOWN.   Do not use on open                           Wound or open sores. Avoid contact with eyes, ears mouth and genitals (private parts).                        Genitals (private parts) with your normal soap.              6.  Wash thoroughly, paying special attention to the area where your surgery  will be performed.   7.  Thoroughly rinse your body with warm water from the neck down.   8.  DO NOT shower/wash with your normal soap after using and rinsing off  the CHG Soap .                9.  Pat yourself dry with a clean towel.             10.  Wear clean pajamas.             11.  Place clean sheets on your bed the night of your first shower and do not  sleep with pets.  Day of Surgery : Do not apply any lotions/deodorants the morning of surgery.  Please wear clean clothes to the hospital/surgery center.  FAILURE TO FOLLOW THESE INSTRUCTIONS MAY RESULT IN THE CANCELLATION OF YOUR SURGERY    PATIENT SIGNATURE_________________________________  ______________________________________________________________________

## 2014-08-13 ENCOUNTER — Ambulatory Visit (HOSPITAL_COMMUNITY): Payer: BLUE CROSS/BLUE SHIELD | Admitting: Anesthesiology

## 2014-08-13 ENCOUNTER — Encounter (HOSPITAL_COMMUNITY)
Admission: RE | Disposition: A | Discharge status: Home / Self Care | Payer: Self-pay | Source: Ambulatory Visit | Attending: General Surgery

## 2014-08-13 ENCOUNTER — Observation Stay (HOSPITAL_COMMUNITY)
Admission: RE | Admit: 2014-08-13 | Discharge: 2014-08-14 | Disposition: A | Discharge status: Home / Self Care | Payer: BLUE CROSS/BLUE SHIELD | Source: Ambulatory Visit | Attending: General Surgery | Admitting: General Surgery

## 2014-08-13 ENCOUNTER — Encounter (HOSPITAL_COMMUNITY): Payer: Self-pay | Admitting: *Deleted

## 2014-08-13 DIAGNOSIS — E780 Pure hypercholesterolemia: Secondary | ICD-10-CM | POA: Diagnosis not present

## 2014-08-13 DIAGNOSIS — G4730 Sleep apnea, unspecified: Secondary | ICD-10-CM | POA: Insufficient documentation

## 2014-08-13 DIAGNOSIS — E042 Nontoxic multinodular goiter: Secondary | ICD-10-CM | POA: Diagnosis present

## 2014-08-13 DIAGNOSIS — D34 Benign neoplasm of thyroid gland: Secondary | ICD-10-CM | POA: Diagnosis not present

## 2014-08-13 DIAGNOSIS — I10 Essential (primary) hypertension: Secondary | ICD-10-CM | POA: Insufficient documentation

## 2014-08-13 DIAGNOSIS — E063 Autoimmune thyroiditis: Secondary | ICD-10-CM | POA: Insufficient documentation

## 2014-08-13 DIAGNOSIS — E890 Postprocedural hypothyroidism: Secondary | ICD-10-CM

## 2014-08-13 DIAGNOSIS — M1990 Unspecified osteoarthritis, unspecified site: Secondary | ICD-10-CM | POA: Insufficient documentation

## 2014-08-13 DIAGNOSIS — Z882 Allergy status to sulfonamides status: Secondary | ICD-10-CM | POA: Diagnosis not present

## 2014-08-13 DIAGNOSIS — Z9889 Other specified postprocedural states: Secondary | ICD-10-CM

## 2014-08-13 HISTORY — PX: THYROIDECTOMY: SHX17

## 2014-08-13 SURGERY — THYROIDECTOMY
Anesthesia: General

## 2014-08-13 MED ORDER — FENTANYL CITRATE 0.05 MG/ML IJ SOLN
INTRAMUSCULAR | Status: AC
  Filled 2014-08-13: qty 5

## 2014-08-13 MED ORDER — BUPIVACAINE HCL (PF) 0.25 % IJ SOLN
INTRAMUSCULAR | Status: AC
  Filled 2014-08-13: qty 30

## 2014-08-13 MED ORDER — LIDOCAINE HCL (CARDIAC) 20 MG/ML IV SOLN
INTRAVENOUS | Status: AC
  Filled 2014-08-13: qty 5

## 2014-08-13 MED ORDER — ONDANSETRON HCL 4 MG/2ML IJ SOLN
INTRAMUSCULAR | Status: DC | PRN
  Administered 2014-08-13: 4 mg via INTRAVENOUS

## 2014-08-13 MED ORDER — PHENOL 1.4 % MT LIQD
1.0000 | OROMUCOSAL | Status: DC | PRN
  Filled 2014-08-13: qty 177

## 2014-08-13 MED ORDER — DEXTROSE-NACL 5-0.9 % IV SOLN
INTRAVENOUS | Status: DC
  Administered 2014-08-13: 13:00:00 via INTRAVENOUS

## 2014-08-13 MED ORDER — SUCCINYLCHOLINE CHLORIDE 20 MG/ML IJ SOLN
INTRAMUSCULAR | Status: DC | PRN
  Administered 2014-08-13: 100 mg via INTRAVENOUS

## 2014-08-13 MED ORDER — FENTANYL CITRATE 0.05 MG/ML IJ SOLN
INTRAMUSCULAR | Status: DC | PRN
  Administered 2014-08-13: 50 ug via INTRAVENOUS
  Administered 2014-08-13: 100 ug via INTRAVENOUS
  Administered 2014-08-13 (×2): 50 ug via INTRAVENOUS

## 2014-08-13 MED ORDER — BUPIVACAINE HCL 0.25 % IJ SOLN
INTRAMUSCULAR | Status: DC | PRN
  Administered 2014-08-13: 5 mL

## 2014-08-13 MED ORDER — CHLORHEXIDINE GLUCONATE 4 % EX LIQD: 1.0000 "application " | Freq: Once | CUTANEOUS | Status: DC

## 2014-08-13 MED ORDER — PROPOFOL 10 MG/ML IV BOLUS
INTRAVENOUS | Status: AC
  Filled 2014-08-13: qty 20

## 2014-08-13 MED ORDER — CEFAZOLIN SODIUM-DEXTROSE 2-3 GM-% IV SOLR
2.0000 g | INTRAVENOUS | Status: AC
  Administered 2014-08-13: 2 g via INTRAVENOUS

## 2014-08-13 MED ORDER — GLYCOPYRROLATE 0.2 MG/ML IJ SOLN
INTRAMUSCULAR | Status: DC | PRN
  Administered 2014-08-13: 0.6 mg via INTRAVENOUS

## 2014-08-13 MED ORDER — LIDOCAINE HCL (CARDIAC) 20 MG/ML IV SOLN
INTRAVENOUS | Status: DC | PRN
  Administered 2014-08-13: 50 mg via INTRAVENOUS

## 2014-08-13 MED ORDER — LACTATED RINGERS IV SOLN
INTRAVENOUS | Status: DC | PRN
  Administered 2014-08-13 (×2): via INTRAVENOUS

## 2014-08-13 MED ORDER — DEXAMETHASONE SODIUM PHOSPHATE 10 MG/ML IJ SOLN
INTRAMUSCULAR | Status: DC | PRN
  Administered 2014-08-13: 10 mg via INTRAVENOUS

## 2014-08-13 MED ORDER — ROCURONIUM BROMIDE 100 MG/10ML IV SOLN
INTRAVENOUS | Status: DC | PRN
  Administered 2014-08-13: 5 mg via INTRAVENOUS
  Administered 2014-08-13: 25 mg via INTRAVENOUS
  Administered 2014-08-13 (×2): 10 mg via INTRAVENOUS

## 2014-08-13 MED ORDER — PROPOFOL 10 MG/ML IV BOLUS
INTRAVENOUS | Status: DC | PRN
  Administered 2014-08-13: 160 mg via INTRAVENOUS

## 2014-08-13 MED ORDER — HYDROMORPHONE HCL 1 MG/ML IJ SOLN
INTRAMUSCULAR | Status: AC
  Filled 2014-08-13: qty 1

## 2014-08-13 MED ORDER — MIDAZOLAM HCL 5 MG/5ML IJ SOLN
INTRAMUSCULAR | Status: DC | PRN
  Administered 2014-08-13: 2 mg via INTRAVENOUS

## 2014-08-13 MED ORDER — MIDAZOLAM HCL 2 MG/2ML IJ SOLN
INTRAMUSCULAR | Status: AC
  Filled 2014-08-13: qty 2

## 2014-08-13 MED ORDER — OXYCODONE-ACETAMINOPHEN 5-325 MG PO TABS: 1.0000 | ORAL_TABLET | ORAL | Status: DC | PRN

## 2014-08-13 MED ORDER — DEXAMETHASONE SODIUM PHOSPHATE 10 MG/ML IJ SOLN
INTRAMUSCULAR | Status: AC
  Filled 2014-08-13: qty 1

## 2014-08-13 MED ORDER — CEFAZOLIN SODIUM-DEXTROSE 2-3 GM-% IV SOLR
INTRAVENOUS | Status: AC
  Filled 2014-08-13: qty 50

## 2014-08-13 MED ORDER — ROCURONIUM BROMIDE 100 MG/10ML IV SOLN
INTRAVENOUS | Status: AC
  Filled 2014-08-13: qty 1

## 2014-08-13 MED ORDER — HYDROCODONE-ACETAMINOPHEN 5-325 MG PO TABS
1.0000 | ORAL_TABLET | ORAL | Status: DC | PRN
  Administered 2014-08-13: 1 via ORAL
  Filled 2014-08-13: qty 1

## 2014-08-13 MED ORDER — PHENYLEPHRINE 40 MCG/ML (10ML) SYRINGE FOR IV PUSH (FOR BLOOD PRESSURE SUPPORT)
PREFILLED_SYRINGE | INTRAVENOUS | Status: AC
  Filled 2014-08-13: qty 10

## 2014-08-13 MED ORDER — HYDROMORPHONE HCL 1 MG/ML IJ SOLN
0.2500 mg | INTRAMUSCULAR | Status: DC | PRN
  Administered 2014-08-13 (×2): 0.5 mg via INTRAVENOUS

## 2014-08-13 MED ORDER — PHENYLEPHRINE HCL 10 MG/ML IJ SOLN
INTRAMUSCULAR | Status: DC | PRN
  Administered 2014-08-13: 80 ug via INTRAVENOUS
  Administered 2014-08-13: 40 ug via INTRAVENOUS

## 2014-08-13 MED ORDER — GLYCOPYRROLATE 0.2 MG/ML IJ SOLN
INTRAMUSCULAR | Status: AC
  Filled 2014-08-13: qty 3

## 2014-08-13 MED ORDER — HYDROMORPHONE HCL 1 MG/ML IJ SOLN: 1.0000 mg | INTRAMUSCULAR | Status: DC | PRN

## 2014-08-13 MED ORDER — PROMETHAZINE HCL 25 MG/ML IJ SOLN: 6.2500 mg | INTRAMUSCULAR | Status: DC | PRN

## 2014-08-13 MED ORDER — LACTATED RINGERS IV SOLN: INTRAVENOUS | Status: DC

## 2014-08-13 MED ORDER — ONDANSETRON HCL 4 MG/2ML IJ SOLN: 4.0000 mg | Freq: Four times a day (QID) | INTRAMUSCULAR | Status: DC | PRN

## 2014-08-13 MED ORDER — NEOSTIGMINE METHYLSULFATE 10 MG/10ML IV SOLN
INTRAVENOUS | Status: DC | PRN
  Administered 2014-08-13: 4 mg via INTRAVENOUS

## 2014-08-13 MED ORDER — IRBESARTAN 150 MG PO TABS
150.0000 mg | ORAL_TABLET | Freq: Every day | ORAL | Status: DC
  Administered 2014-08-13: 150 mg via ORAL
  Filled 2014-08-13 (×2): qty 1

## 2014-08-13 MED ORDER — NEOSTIGMINE METHYLSULFATE 10 MG/10ML IV SOLN
INTRAVENOUS | Status: AC
  Filled 2014-08-13: qty 1

## 2014-08-13 MED ORDER — ONDANSETRON HCL 4 MG/2ML IJ SOLN
INTRAMUSCULAR | Status: AC
  Filled 2014-08-13: qty 2

## 2014-08-13 SURGICAL SUPPLY — 39 items
APL SKNCLS STERI-STRIP NONHPOA (GAUZE/BANDAGES/DRESSINGS)
ATTRACTOMAT 16X20 MAGNETIC DRP (DRAPES) ×2 IMPLANT
BENZOIN TINCTURE PRP APPL 2/3 (GAUZE/BANDAGES/DRESSINGS) IMPLANT
BLADE HEX COATED 2.75 (ELECTRODE) ×2 IMPLANT
BLADE SURG 15 STRL LF DISP TIS (BLADE) ×1 IMPLANT
BLADE SURG 15 STRL SS (BLADE) ×2
CHLORAPREP W/TINT 26ML (MISCELLANEOUS) ×3 IMPLANT
CLIP TI MEDIUM 6 (CLIP) ×8 IMPLANT
CLIP TI WIDE RED SMALL 6 (CLIP) ×5 IMPLANT
DISSECTOR ROUND CHERRY 3/8 STR (MISCELLANEOUS) ×1 IMPLANT
DRAPE LAPAROTOMY T 98X78 PEDS (DRAPES) ×2 IMPLANT
ELECT REM PT RETURN 9FT ADLT (ELECTROSURGICAL) ×2
ELECTRODE REM PT RTRN 9FT ADLT (ELECTROSURGICAL) ×1 IMPLANT
GAUZE SPONGE 4X4 12PLY STRL (GAUZE/BANDAGES/DRESSINGS) IMPLANT
GAUZE SPONGE 4X4 16PLY XRAY LF (GAUZE/BANDAGES/DRESSINGS) ×2 IMPLANT
GLOVE BIO SURGEON STRL SZ7.5 (GLOVE) ×7 IMPLANT
GOWN STRL REUS W/TWL XL LVL3 (GOWN DISPOSABLE) ×6 IMPLANT
HEMOSTAT SURGICEL 2X4 FIBR (HEMOSTASIS) ×2 IMPLANT
KIT BASIN OR (CUSTOM PROCEDURE TRAY) ×2 IMPLANT
LIQUID BAND (GAUZE/BANDAGES/DRESSINGS) ×2 IMPLANT
NDL HYPO 25X1 1.5 SAFETY (NEEDLE) ×1 IMPLANT
NEEDLE HYPO 25X1 1.5 SAFETY (NEEDLE) ×2 IMPLANT
PACK BASIC VI WITH GOWN DISP (CUSTOM PROCEDURE TRAY) ×2 IMPLANT
PENCIL BUTTON HOLSTER BLD 10FT (ELECTRODE) ×2 IMPLANT
SHEARS HARMONIC 9CM CVD (BLADE) ×2 IMPLANT
STAPLER VISISTAT 35W (STAPLE) ×2 IMPLANT
STRIP CLOSURE SKIN 1/2X4 (GAUZE/BANDAGES/DRESSINGS) IMPLANT
SUT MNCRL AB 4-0 PS2 18 (SUTURE) ×2 IMPLANT
SUT SILK 2 0 (SUTURE) ×2
SUT SILK 2 0 SH (SUTURE) IMPLANT
SUT SILK 2-0 18XBRD TIE 12 (SUTURE) IMPLANT
SUT SILK 3 0 (SUTURE)
SUT SILK 3-0 18XBRD TIE 12 (SUTURE) ×1 IMPLANT
SUT VIC AB 3-0 SH 18 (SUTURE) ×3 IMPLANT
SYR 20CC LL (SYRINGE) ×2 IMPLANT
SYR BULB IRRIGATION 50ML (SYRINGE) ×2 IMPLANT
TOWEL OR 17X26 10 PK STRL BLUE (TOWEL DISPOSABLE) ×2 IMPLANT
TOWEL OR NON WOVEN STRL DISP B (DISPOSABLE) ×2 IMPLANT
YANKAUER SUCT BULB TIP 10FT TU (MISCELLANEOUS) ×2 IMPLANT

## 2014-08-13 NOTE — Anesthesia Postprocedure Evaluation (Signed)
  Anesthesia Post-op Note  Patient: Mary Munoz  Procedure(s) Performed: Procedure(s): RIGHT THYROIDECTOMY (N/A)  Patient Location: PACU  Anesthesia Type:General  Level of Consciousness: awake  Airway and Oxygen Therapy: Patient Spontanous Breathing  Post-op Pain: mild  Post-op Assessment: Post-op Vital signs reviewed  Post-op Vital Signs: Reviewed  Last Vitals:  Filed Vitals:   08/13/14 1030  BP: 146/74  Pulse: 69  Temp:   Resp: 14    Complications: No apparent anesthesia complications

## 2014-08-13 NOTE — Op Note (Signed)
08/13/2014  10:05 AM  PATIENT:  Mary Munoz  59 y.o. female  PRE-OPERATIVE DIAGNOSIS:  RIGHT THYROID NODULE  POST-OPERATIVE DIAGNOSIS:  right thyroid nodule  PROCEDURE:  Procedure(s): RIGHT THYROIDECTOMY (N/A)  SURGEON:  Surgeon(s) and Role:    * Ralene Ok, MD - Primary    * Donnie Mesa, MD - Assisting  ASSISTANTS: none   ANESTHESIA:   local and general  EBL:  Total I/O In: 1000 [I.V.:1000] Out: -   BLOOD ADMINISTERED:none  DRAINS: none   LOCAL MEDICATIONS USED:  BUPIVICAINE   SPECIMEN:  Source of Specimen:  Right thyroid lobe, stitch in superior lobe  DISPOSITION OF SPECIMEN:  PATHOLOGY  COUNTS:  YES  TOURNIQUET:  * No tourniquets in log *  DICTATION: .Dragon Dictation  Findings: Large right inferior thyroid nodule  Indications for procedure: The patient is a 59 y/o F who had a 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  Pt was offered total or lobectomy and she chose right lobectomy to hopefully avoid thyroid rx post op.  Details of the procedure:  The patient was taken back to the operating room. The patient was placed in supine position with bilateral SCDs in place.  The patient was prepped and draped in the usual sterile fashion. After appropriate anitbiotics were confirmed, a time-out was confirmed and all facts were verified. A 4 cm incision was made approximately 2 fingerbreadths above the sternal notch. Bovie cautery was used to maintain hemostasis dissection was carried down through the platysma. The platysma was elevated and flaps were created superiorly and inferiorly to the thyroid cartilage as well as the sternal notch, repsectively. The strap muscles were identified in the midline and separated. Right-sided strap muscles were elevated off the anterior surface of the thyroid. This dissection was carried laterally. The middle thyroid vein was identified and doubly ligated. We proceeded to dissect away the superior lobe and  Kitners were used to gently dissect the surrounding musculature from the thyroid. The superior thyroid vessels were identified and doubly ligated with medium clips and the harmonic scalpel was used to transect the vessels. At this time this freed up the superior lobe was able to deliver this into the wound. We also identified the superior parathyroid gland which we preserved. We continued to dissect the thyroid off of the trachea from lateral to medial direction. The right recurrent laryngeal nerve was not identified, however I feel we were right on the capsule of the thyroid and avoided any injury to the right recurrent laryngeal nerve.The inferior thyroid vessels were identified and doubly ligated with clips. At this time Berry's ligament was dissected away from the trachea. This delivered the right lobe of the thyroid into the wound and the harmonic scalpel was used to divide the thyroid in the midline. A superior stitch was then placed in the superior thyroid lobe. The area was irrigated out. The dissection bed was hemostatic. We placed fibrillar hemostatic agent into the wound. Strap muscles were then reapproximated in the midline with interrupted 3-0 Vicryl stitches. The platysma was reapproximated using 3-0 Vicryl stitches in interrupted fashion. Skin was then reapproximated using a running subcuticular 4-0 Monocryl. The skin was then dressed with Dermabond. The patient was taken to the recovery room in stable condition.    PLAN OF CARE: Admit for overnight observation  PATIENT DISPOSITION:  PACU - hemodynamically stable.   Delay start of Pharmacological VTE agent (>24hrs) due to surgical blood loss or risk of bleeding: not applicable

## 2014-08-13 NOTE — Anesthesia Preprocedure Evaluation (Signed)
Anesthesia Evaluation  Patient identified by MRN, date of birth, ID band Patient awake    Reviewed: Allergy & Precautions, NPO status   Airway Mallampati: II  TM Distance: >3 FB Neck ROM: Full    Dental   Pulmonary sleep apnea ,          Cardiovascular hypertension, + dysrhythmias     Neuro/Psych    GI/Hepatic Neg liver ROS, History noted. CE   Endo/Other    Renal/GU negative Renal ROS     Musculoskeletal  (+) Arthritis -,   Abdominal   Peds  Hematology   Anesthesia Other Findings   Reproductive/Obstetrics                             Anesthesia Physical Anesthesia Plan  ASA: III  Anesthesia Plan: General   Post-op Pain Management:    Induction: Intravenous  Airway Management Planned: Oral ETT  Additional Equipment:   Intra-op Plan:   Post-operative Plan: Extubation in OR  Informed Consent: I have reviewed the patients History and Physical, chart, labs and discussed the procedure including the risks, benefits and alternatives for the proposed anesthesia with the patient or authorized representative who has indicated his/her understanding and acceptance.   Dental advisory given  Plan Discussed with: CRNA and Anesthesiologist  Anesthesia Plan Comments:         Anesthesia Quick Evaluation

## 2014-08-13 NOTE — H&P (Signed)
History of Present Illness Mary Ok MD; 06/26/2014 10:57 AM) Patient words: thyroid.  The patient is a 58 year old female who presents with thyroid cancer. The patient is a 59 year old female who is referred by Dr. Orland Mustard for evaluation of the right thyroid nodule. Patient underwent ultrasound which revealed a 3.3 cm right inferior lobe nodule. Patient underwent FNA biopsy which revealed a nodule suspicious for Hurthle cell cancer. Patient states that prior to her physical exam she had no signs or symptoms or dysphagia. Patient had routine TSH levels which have been normal in the past.   Other Problems Davy Pique Bynum, [place]; 06/26/2014 10:22 AM) High blood pressure Hypercholesterolemia Sleep Apnea Thyroid Cancer Thyroid Disease  Past Surgical History Marjean Donna, CMA; 06/26/2014 10:21 AM) Hip Surgery Bilateral.  Diagnostic Studies History Marjean Donna, CMA; 06/26/2014 10:21 AM) Colonoscopy 1-5 years ago Mammogram within last year Pap Smear 1-5 years ago  Allergies Marjean Donna, CMA; 06/26/2014 10:23 AM) Sulfa Antibiotics Citric Acid *CHEMICALS*  Medication History (Sonya Bynum, CMA; 06/26/2014 10:23 AM) Benicar (20MG  Tablet, Oral) Active.  Social History Marjean Donna, CMA; 06/26/2014 10:22 AM) Caffeine use Carbonated beverages, Coffee, Tea. No alcohol use No drug use Tobacco use Never smoker.  Family History Marjean Donna, CMA; 06/26/2014 10:21 AM) Diabetes Mellitus Mother, Sister. Heart Disease Father. Hypertension Father, Mother, Sister. Thyroid problems Mother, Sister.  Pregnancy / Birth History Marjean Donna, [place]; 06/26/2014 10:22 AM) Age at menarche 10 years. Age of menopause 41-60 Contraceptive History Oral contraceptives. Gravida 1 Irregular periods Maternal age 12-20 Para 1  Review of Systems ([place]; 06/26/2014 10:21 AM) General Not Present- Appetite Loss, Chills, Fatigue, Fever, Night Sweats, Weight Gain and Weight  Loss. Skin Not Present- Change in Wart/Mole, Dryness, Hives, Jaundice, New Lesions, Non-Healing Wounds, Rash and Ulcer. HEENT Present- Wears glasses/contact lenses. Not Present- Earache, Hearing Loss, Hoarseness, Nose Bleed, Oral Ulcers, Ringing in the Ears, Seasonal Allergies, Sinus Pain, Sore Throat, Visual Disturbances and Yellow Eyes. Respiratory Present- Snoring. Not Present- Bloody sputum, Chronic Cough, Difficulty Breathing and Wheezing. Breast Not Present- Breast Mass, Breast Pain, Nipple Discharge and Skin Changes. Cardiovascular Not Present- Chest Pain, Difficulty Breathing Lying Down, Leg Cramps, Palpitations, Rapid Heart Rate, Shortness of Breath and Swelling of Extremities. Gastrointestinal Not Present- Abdominal Pain, Bloating, Bloody Stool, Change in Bowel Habits, Chronic diarrhea, Constipation, Difficulty Swallowing, Excessive gas, Gets full quickly at meals, Hemorrhoids, Indigestion, Nausea, Rectal Pain and Vomiting. Female Genitourinary Not Present- Frequency, Nocturia, Painful Urination, Pelvic Pain and Urgency. Musculoskeletal Not Present- Back Pain, Joint Pain, Joint Stiffness, Muscle Pain, Muscle Weakness and Swelling of Extremities. Neurological Not Present- Decreased Memory, Fainting, Headaches, Numbness, Seizures, Tingling, Tremor, Trouble walking and Weakness. Psychiatric Not Present- Anxiety, Bipolar, Change in Sleep Pattern, Depression, Fearful and Frequent crying. Endocrine Not Present- Cold Intolerance, Excessive Hunger, Hair Changes, Heat Intolerance, Hot flashes and New Diabetes. Hematology Not Present- Easy Bruising, Excessive bleeding, Gland problems, HIV and Persistent Infections.   Vitals (Sonya Bynum CMA; 06/26/2014 10:22 AM) 06/26/2014 10:22 AM Weight: 186 lb Height: 62in Body Surface Area: 1.92 m Body Mass Index: 34.02 kg/m Temp.: 97.32F(Temporal)  Pulse: 76 (Regular)  BP: 128/78 (Sitting, Left Arm, Standard)    Physical Exam Mary Ok MD; 06/26/2014 10:58 AM) General Mental Status-Alert. General Appearance-Consistent with stated age. Hydration-Well hydrated. Voice-Normal.  Head and Neck Head-normocephalic, atraumatic with no lesions or palpable masses. Trachea-midline. Thyroid Gland Characteristics - normal size and consistency. Note: Right thyroid nodule, mobile   Chest and Lung Exam Chest and lung exam  reveals -quiet, even and easy respiratory effort with no use of accessory muscles and on auscultation, normal breath sounds, no adventitious sounds and normal vocal resonance. Inspection Chest Wall - Normal. Back - normal.  Cardiovascular Cardiovascular examination reveals -normal heart sounds, regular rate and rhythm with no murmurs and normal pedal pulses bilaterally.  Abdomen Inspection Inspection of the abdomen reveals - No Hernias. Skin - Scar - no surgical scars. Palpation/Percussion Palpation and Percussion of the abdomen reveal - Soft, Non Tender, No Rebound tenderness, No Rigidity (guarding) and No hepatosplenomegaly. Auscultation Auscultation of the abdomen reveals - Bowel sounds normal.    Assessment & Plan Mary Ok MD; 06/26/2014 11:00 AM) THYROID CARCINOMA (193  C73) Impression: Pt is a 59 y/o F with R thyroid nodule suspicious for Hurthel Cell CA I d/w the pt the options of proceeding with total vs. partial thyroidectomy. THe patient is in favor for R thyroidectomy.  All risks and benefits were discussed with the patient to generally include: infection, bleeding, damage to the recurrent laryngeal nerve, damage to parathyroid glands, and possible need for further surgery. Alternatives were offered and described. All questions were answered and the patient voiced understanding of the procedure and wishes to proceed.

## 2014-08-13 NOTE — Discharge Instructions (Signed)
Thyroidectomy °Care After °Refer to this sheet in the next few weeks. These instructions provide you with general information on caring for yourself after you leave the hospital. Your caregiver also may give you specific instructions. Your treatment has been planned according to the most current medical practices available, but problems sometimes occur. Call your caregiver if you have any problems or questions after your procedure. °HOME CARE INSTRUCTIONS  °· It is normal to be sore for a few weeks following surgery. See your caregiver if your pain seems to be getting worse rather than better. °· Only take over-the-counter or prescription medicines for pain, discomfort, or fever as directed by your caregiver. Avoid taking medicines that contain aspirin and ibuprofen because they increase the risk of bleeding. °· Shower rather than bathe until instructed otherwise by your caregiver. °· Change your bandages (dressings) as directed by your caregiver. °· You may resume a normal diet and activities as directed by your caregiver. °· Avoid lifting weight greater than 20 lb (9 kg) or participating in heavy exercise or contact sports for 10 days or as instructed by your caregiver. °· Make an appointment to see your caregiver for stitch (suture) or staple removal. °SEEK MEDICAL CARE IF:  °· You have increased bleeding from your wound. °· You have redness, swelling, or increasing pain from your wound or in your neck. °· There is pus coming from your wound. °· You have an oral temperature above 102° F (38.9° C). °· There is a bad smell coming from the wound or dressing. °· You develop lightheadedness or feel faint. °· You develop numbness, tingling, or muscle spasms in your arms, hands, feet, or face. °· You have difficulty swallowing. °SEEK IMMEDIATE MEDICAL CARE IF:  °· You develop a rash. °· You have difficulty breathing. °· You hear whistling noises that come from your chest. °· You develop a cough that becomes increasingly  worse. °· You develop any reaction or side effects to medicines given. °· There is swelling in your neck. °· You develop changes in speech or hoarseness, which is getting worse. °MAKE SURE YOU:  °· Understand these instructions. °· Will watch your condition. °· Will get help right away if you are not doing well or get worse. °Document Released: 11/12/2004 Document Revised: 07/18/2011 Document Reviewed: 07/02/2010 °Exit[employer]® Patient Information ©2015 [employer]. This information is not intended to replace advice given to you by your health care provider. Make sure you discuss any questions you have with your health care provider. ° °

## 2014-08-13 NOTE — Anesthesia Procedure Notes (Signed)
Procedure Name: Intubation Date/Time: 08/13/2014 8:35 AM Performed by: Noralyn Pick D Pre-anesthesia Checklist: Patient identified, Emergency Drugs available, Suction available and Patient being monitored Patient Re-evaluated:Patient Re-evaluated prior to inductionOxygen Delivery Method: Circle System Utilized Preoxygenation: Pre-oxygenation with 100% oxygen Intubation Type: IV induction Ventilation: Mask ventilation without difficulty Laryngoscope Size: Mac and 3 Tube type: Oral Number of attempts: 1 Airway Equipment and Method: Stylet and Oral airway Placement Confirmation: ETT inserted through vocal cords under direct vision,  positive ETCO2 and breath sounds checked- equal and bilateral Secured at: 22 cm Tube secured with: Tape Dental Injury: Teeth and Oropharynx as per pre-operative assessment

## 2014-08-13 NOTE — Transfer of Care (Signed)
Immediate Anesthesia Transfer of Care Note  Patient: Mary Munoz  Procedure(s) Performed: Procedure(s): RIGHT THYROIDECTOMY (N/A)  Patient Location: PACU  Anesthesia Type:General  Level of Consciousness: awake, alert  and oriented  Airway & Oxygen Therapy: Patient Spontanous Breathing and Patient connected to face mask oxygen  Post-op Assessment: Report given to RN and Post -op Vital signs reviewed and stable  Post vital signs: Reviewed and stable  Last Vitals:  Filed Vitals:   08/13/14 0648  BP: 153/78  Pulse: 76  Temp: 36.7 C  Resp: 18    Complications: No apparent anesthesia complications

## 2014-08-14 ENCOUNTER — Encounter (HOSPITAL_COMMUNITY): Payer: Self-pay | Admitting: General Surgery

## 2014-08-14 DIAGNOSIS — D34 Benign neoplasm of thyroid gland: Secondary | ICD-10-CM | POA: Diagnosis not present

## 2014-08-14 NOTE — Discharge Summary (Signed)
Physician Discharge Summary  Patient ID: Mary Munoz MRN: [id number] DOB/AGE: 11-14-1955 59 y.o.  Admit date: 08/13/2014 Discharge date: 08/14/2014  Admission Diagnoses: s/p right thyroid lobectomy Discharge Diagnoses:  Active Problems:   S/P partial thyroidectomy   Discharged Condition: good  Hospital Course: Pt was admitted post op.  She was doing well with minimal pain. She was tolerated a diet.  She was otherwise ambulating well.  She was deemed stable  For DC and Dc'd home.  Consults: None  Significant Diagnostic Studies: none  Treatments: surgery: as above  Discharge Exam: Blood pressure 141/74, pulse 68, temperature 98.6 F (37 C), temperature source Oral, resp. rate 14, height 5\' 2"  (1.575 m), weight 85.276 kg (188 lb), SpO2 97 %. General appearance: alert and cooperative Incision/Wound: c/d/i  Disposition: 06-Home-Health Care Svc  Discharge Instructions    Diet - low sodium heart healthy    Complete by:  As directed      Increase activity slowly    Complete by:  As directed             Medication List    TAKE these medications        olmesartan 20 MG tablet  Commonly known as:  BENICAR  Take 20 mg by mouth every morning.     oxyCODONE 5 MG immediate release tablet  Commonly known as:  Oxy IR/ROXICODONE  Take 1-2 tablets (5-10 mg total) by mouth every 3 (three) hours as needed.     oxyCODONE-acetaminophen 5-325 MG per tablet  Commonly known as:  ROXICET  Take 1-2 tablets by mouth every 4 (four) hours as needed for severe pain.     rivaroxaban 10 MG Tabs tablet  Commonly known as:  XARELTO  - Take 1 tablet (10 mg total) by mouth daily with breakfast. Take Xarelto for two and a half more weeks, then discontinue Xarelto.  - Once the patient has completed the blood thinner regimen, then take a Baby 81 mg Aspirin daily for four more weeks.           Follow-up Information    Follow up with Reyes Ivan, MD. Schedule an appointment as  soon as possible for a visit in 2 weeks.   Specialty:  General Surgery   Why:  For wound re-check   Contact information:   [address] [place] [address] [phone]       Signed: Rosario Jacks., [facility] 08/14/2014, 7:51 AM

## 2014-08-14 NOTE — Progress Notes (Signed)
UR completed 

## 2014-08-14 NOTE — Progress Notes (Signed)
Discharge instructions given to pt with all questions answered. Teaching accepted.

## 2014-09-25 ENCOUNTER — Other Ambulatory Visit: Payer: Self-pay

## 2014-09-25 DIAGNOSIS — Z1231 Encounter for screening mammogram for malignant neoplasm of breast: Secondary | ICD-10-CM

## 2014-10-15 ENCOUNTER — Encounter: Payer: Self-pay | Admitting: *Deleted

## 2014-10-15 ENCOUNTER — Encounter (INDEPENDENT_AMBULATORY_CARE_PROVIDER_SITE_OTHER): Payer: Self-pay

## 2014-10-15 ENCOUNTER — Ambulatory Visit
Admission: RE | Admit: 2014-10-15 | Discharge: 2014-10-15 | Disposition: A | Discharge status: Home / Self Care | Payer: BLUE CROSS/BLUE SHIELD | Source: Ambulatory Visit

## 2014-10-15 DIAGNOSIS — Z1231 Encounter for screening mammogram for malignant neoplasm of breast: Secondary | ICD-10-CM

## 2014-10-16 ENCOUNTER — Ambulatory Visit (INDEPENDENT_AMBULATORY_CARE_PROVIDER_SITE_OTHER): Payer: BLUE CROSS/BLUE SHIELD | Admitting: Neurology

## 2014-10-16 ENCOUNTER — Encounter: Payer: Self-pay | Admitting: Neurology

## 2014-10-16 VITALS — BP 132/82 | HR 70 | Ht 62.0 in | Wt 192.0 lb

## 2014-10-16 DIAGNOSIS — R42 Dizziness and giddiness: Secondary | ICD-10-CM | POA: Diagnosis not present

## 2014-10-16 NOTE — Patient Instructions (Signed)
I had a long discussion with the patient with regards to her dizziness and vertigo which seems to be improving but because it was of sudden onset need to check MRI scan of the brain to look for vascular or structural lesions. Referred to vestibular rehabilitation for dizziness exercises. Return for follow-up in 2 months or call earlier if necessary.   Vertigo Vertigo means you feel like you are moving when you are not. Vertigo can make you feel like things around you are moving when they are not. This problem often goes away on its own.  HOME CARE   Follow your doctor's instructions.  Avoid driving.  Avoid using heavy machinery.  Avoid doing any activity that could be dangerous if you have a vertigo attack.  Tell your doctor if a medicine seems to cause your vertigo. GET HELP RIGHT AWAY IF:   Your medicines do not help or make you feel worse.  You have trouble talking or walking.  You feel weak or have trouble using your arms, hands, or legs.  You have bad headaches.  You keep feeling sick to your stomach (nauseous) or throwing up (vomiting).  Your vision changes.  A family member notices changes in your behavior.  Your problems get worse. MAKE SURE YOU:  Understand these instructions.  Will watch your condition.  Will get help right away if you are not doing well or get worse. Document Released: 02/02/2008 Document Revised: 07/18/2011 Document Reviewed: 11/11/2010 [facility] Patient Information 2015 [place], [place]. This information is not intended to replace advice given to you by your health care provider. Make sure you discuss any questions you have with your health care provider.

## 2014-10-16 NOTE — Progress Notes (Signed)
[facility] [address] [place]. [place] [address] [phone]       OFFICE CONSULT NOTE  Mary Munoz Date of Birth:  1956-04-23 Medical Record Number:  [id number]   Referring MD:  Jerrell Belfast  Reason for Referral:  dizziness  HPI: Mary Munoz is a pleasant 60 year old Caucasian lady who developed sudden onset of acute vertigo, dizziness, nausea and vomiting a few days after undergoing surgery for thyroid cancer. She initially saw her primary physician who called in meclizine and Zofran which helped somewhat. She was eventually referred to Dr. Wilburn Cornelia ENT who thought she had what vestibular neuronitis. She denied any accompanying hearing loss, tinnitus, prior vertigo. Gradually the vertigo and dizziness have improved but is now only intermittent and positional if she makes a sudden and rapid head and neck movement. She was weaned off meclizine without worsening of symptoms. She denies any hearing infection, trauma, closed head injury. She has not had any migraine headaches for 25 years. She denies any double vision, focal extremity weakness, gait or balance problems. Her symptoms seem to be gradually improving. She has not had any brain imaging studies done. She complains of slight hoarseness of voice and difficulty swallowing with saliva pooling in the back of her throat probably residual effects from thyroid surgery but this also seems to be improving.  ROS:   14 system review of systems is positive for vertigo, dizziness, weight gain, fatigue, spinning sensation, trouble swallowing, shortness of breath, wheezing, difficulty swallowing, hoarseness of voice and all other systems negative  PMH:  Past Medical History  Diagnosis Date  . Hypertension   . 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 Hypercholesterolemia   . OSA on CPAP   . History of shingles ? 2010    NO RESIDUAL PROBLEMS FROM SHINGLES  . Thyroid nodule    RIGHT--UNDER OBSERVATION-YEARLY BLOOD WORK-NO MEDICATION  . Obesity   . Cardiac dysrhythmia, unspecified     pvc's Dr Marlou Porch evaluated 4/14- in [facility].    Marland Kitchen Vestibular neuronitis   . Vertigo   . OSA on CPAP   . Fatigue   . SOB (shortness of breath)     Social History:  History   Social History  . Marital Status: Married    Spouse Name: N/A  . Number of Children: 1  . Years of Education: college   Occupational History  . STAFF [employer]    Social History Main Topics  . Smoking status: Never Smoker   . Smokeless tobacco: Never Used  . Alcohol Use: No  . Drug Use: No  . Sexual Activity:    Partners: Male    Birth Control/ Protection: Post-menopausal, Surgical     Comment: BTL   Other Topics Concern  . Not on file   Social History Narrative   Married   Right handed   Caffeine use- 1 cup coffee daily, occasional tea    Medications:   Current Outpatient Prescriptions on File Prior to Visit  Medication Sig Dispense Refill  . olmesartan (BENICAR) 20 MG tablet Take 20 mg by mouth every morning.     No current facility-administered medications on file prior to visit.    Allergies:   Allergies  Allergen Reactions  . Citric Acid Other (See Comments)    Bladder infection  . Sulfa Antibiotics Other (See Comments)    Gets sores in mouth    Physical Exam General: well developed, well nourished middle-age Caucasian lady, seated,  in no evident distress Head: head normocephalic and atraumatic.   Neck: supple with no carotid or supraclavicular bruits Cardiovascular: regular rate and rhythm, no murmurs Musculoskeletal: no deformity Skin:  no rash/petichiae Vascular:  Normal pulses all extremities  Neurologic Exam Mental Status: Awake and fully alert. Oriented to place and time. Recent and remote memory intact. Attention span, concentration and fund of knowledge appropriate. Mood and affect appropriate.  Cranial Nerves: Fundoscopic exam reveals sharp disc margins. Pupils  equal, briskly reactive to light. Extraocular movements full without nystagmus. Visual fields full to confrontation. Hearing intact. Facial sensation intact. Face, tongue, palate moves normally and symmetrically.  Motor: Normal bulk and tone. Normal strength in all tested extremity muscles. Sensory.: intact to touch , pinprick , position and vibratory sensation.  Coordination: Rapid alternating movements normal in all extremities. Finger-to-nose and heel-to-shin performed accurately bilaterally. Head shaking produces subjective dizziness without objective nystagmus. Fukuda stepping test patient clearly moved off her base as well as rotated 90 to the left. Hallpike maneuver was not done. Gait and Station: Arises from chair without difficulty. Stance is normal. Gait demonstrates normal stride length and balance . Able to heel, toe and tandem walk without difficulty.  Reflexes: 1+ and symmetric. Toes downgoing.      ASSESSMENT: 1 year lady with sudden onset of vertigo and dizziness 2 months ago possibly vestibular neuronitis but structure in vascular etiology needs to be ruled out.     PLAN: I had a long discussion with the patient with regards to her dizziness and vertigo which seems to be improving but because it was of sudden onset need to check MRI scan of the brain to look for vascular or structural lesions. Referred to vestibular rehabilitation for dizziness exercises. Return for follow-up in 2 months or call earlier if necessary.  Antony Contras, MD  Note: This document was prepared with digital dictation and possible smart phrase technology. Any transcriptional errors that result from this process are unintentional.

## 2014-10-22 ENCOUNTER — Ambulatory Visit (INDEPENDENT_AMBULATORY_CARE_PROVIDER_SITE_OTHER): Payer: BLUE CROSS/BLUE SHIELD

## 2014-10-22 DIAGNOSIS — R42 Dizziness and giddiness: Secondary | ICD-10-CM

## 2014-10-23 MED ORDER — GADOPENTETATE DIMEGLUMINE 469.01 MG/ML IV SOLN: 19.0000 mL | Freq: Once | INTRAVENOUS | Status: AC | PRN

## 2014-10-28 ENCOUNTER — Telehealth: Payer: Self-pay | Admitting: *Deleted

## 2014-10-28 NOTE — Telephone Encounter (Signed)
-----   Message from Garvin Fila, MD sent at 10/28/2014  2:58 PM EDT ----- Mary Munoz inform the patient had MRI scan of the brain shows no significant abnormalities.

## 2014-10-28 NOTE — Telephone Encounter (Signed)
Spoke with patient and informed her of D Sethi's detailed results report. She verbalized understanding, appreciation.

## 2014-11-12 ENCOUNTER — Other Ambulatory Visit: Payer: Self-pay | Admitting: Family Medicine

## 2014-11-12 ENCOUNTER — Ambulatory Visit
Admission: RE | Admit: 2014-11-12 | Discharge: 2014-11-12 | Disposition: A | Discharge status: Home / Self Care | Payer: BLUE CROSS/BLUE SHIELD | Source: Ambulatory Visit | Attending: Family Medicine | Admitting: Family Medicine

## 2014-11-12 DIAGNOSIS — R0602 Shortness of breath: Secondary | ICD-10-CM

## 2014-12-24 ENCOUNTER — Ambulatory Visit: Payer: BLUE CROSS/BLUE SHIELD | Admitting: Neurology

## 2015-06-24 ENCOUNTER — Other Ambulatory Visit: Payer: Self-pay | Admitting: Endocrinology

## 2015-06-24 DIAGNOSIS — E041 Nontoxic single thyroid nodule: Secondary | ICD-10-CM

## 2015-07-21 DIAGNOSIS — R198 Other specified symptoms and signs involving the digestive system and abdomen: Secondary | ICD-10-CM | POA: Insufficient documentation

## 2015-07-21 DIAGNOSIS — J3801 Paralysis of vocal cords and larynx, unilateral: Secondary | ICD-10-CM | POA: Insufficient documentation

## 2015-07-21 DIAGNOSIS — R053 Chronic cough: Secondary | ICD-10-CM | POA: Insufficient documentation

## 2015-07-21 DIAGNOSIS — R0989 Other specified symptoms and signs involving the circulatory and respiratory systems: Secondary | ICD-10-CM | POA: Insufficient documentation

## 2015-07-21 DIAGNOSIS — K219 Gastro-esophageal reflux disease without esophagitis: Secondary | ICD-10-CM | POA: Insufficient documentation

## 2015-07-21 DIAGNOSIS — R490 Dysphonia: Secondary | ICD-10-CM | POA: Insufficient documentation

## 2015-09-29 ENCOUNTER — Other Ambulatory Visit: Payer: Self-pay

## 2015-09-29 DIAGNOSIS — Z1231 Encounter for screening mammogram for malignant neoplasm of breast: Secondary | ICD-10-CM

## 2015-10-13 ENCOUNTER — Ambulatory Visit
Admission: RE | Admit: 2015-10-13 | Discharge: 2015-10-13 | Disposition: A | Discharge status: Home / Self Care | Payer: BLUE CROSS/BLUE SHIELD | Source: Ambulatory Visit

## 2015-10-13 DIAGNOSIS — Z1231 Encounter for screening mammogram for malignant neoplasm of breast: Secondary | ICD-10-CM

## 2015-10-27 ENCOUNTER — Other Ambulatory Visit: Payer: Self-pay | Admitting: Otolaryngology

## 2015-11-05 ENCOUNTER — Other Ambulatory Visit: Payer: Self-pay | Admitting: Otolaryngology

## 2015-11-18 ENCOUNTER — Encounter (HOSPITAL_COMMUNITY)
Admission: RE | Admit: 2015-11-18 | Discharge: 2015-11-18 | Disposition: A | Discharge status: Home / Self Care | Payer: BLUE CROSS/BLUE SHIELD | Source: Ambulatory Visit | Attending: Otolaryngology | Admitting: Otolaryngology

## 2015-11-18 ENCOUNTER — Encounter (HOSPITAL_COMMUNITY): Payer: Self-pay

## 2015-11-18 DIAGNOSIS — Z01812 Encounter for preprocedural laboratory examination: Secondary | ICD-10-CM | POA: Insufficient documentation

## 2015-11-18 DIAGNOSIS — I10 Essential (primary) hypertension: Secondary | ICD-10-CM | POA: Insufficient documentation

## 2015-11-18 DIAGNOSIS — I4581 Long QT syndrome: Secondary | ICD-10-CM | POA: Diagnosis not present

## 2015-11-18 DIAGNOSIS — Z01810 Encounter for preprocedural cardiovascular examination: Secondary | ICD-10-CM | POA: Insufficient documentation

## 2015-11-18 HISTORY — DX: Hypothyroidism, unspecified: E03.9

## 2015-11-18 HISTORY — DX: Malignant (primary) neoplasm, unspecified: C80.1

## 2015-11-18 HISTORY — DX: Gastro-esophageal reflux disease without esophagitis: K21.9

## 2015-11-18 LAB — BASIC METABOLIC PANEL
Anion gap: 7 (ref 5–15)
BUN: 11 mg/dL (ref 6–20)
CHLORIDE: 108 mmol/L (ref 101–111)
CO2: 24 mmol/L (ref 22–32)
CREATININE: 0.86 mg/dL (ref 0.44–1.00)
Calcium: 9 mg/dL (ref 8.9–10.3)
GFR calc Af Amer: >60 mL/min (ref >60)
GFR calc non Af Amer: >60 mL/min (ref >60)
GLUCOSE: 90 mg/dL (ref 65–99)
Potassium: 3.7 mmol/L (ref 3.5–5.1)
Sodium: 139 mmol/L (ref 135–145)

## 2015-11-18 LAB — CBC
HEMATOCRIT: 41.4 % (ref 36.0–46.0)
HEMOGLOBIN: 13.4 g/dL (ref 12.0–15.0)
MCH: 28.5 pg (ref 26.0–34.0)
MCHC: 32.4 g/dL (ref 30.0–36.0)
MCV: 87.9 fL (ref 78.0–100.0)
Platelets: 285 10*3/uL (ref 150–400)
RBC: 4.71 MIL/uL (ref 3.87–5.11)
RDW: 13.7 % (ref 11.5–15.5)
WBC: 7.4 10*3/uL (ref 4.0–10.5)

## 2015-11-18 NOTE — Pre-Procedure Instructions (Signed)
Mary Munoz  11/18/2015      [facility] [address] - [place], [place] [address] [place] [address] Phone: [phone] Fax: [place], [place] - [place] [address] Phone: [phone] Fax: [phone]    Your procedure is scheduled on   11/27/15  Friday   Report to [facility] at 530 A.M.  Call this number if you have problems the morning of surgery:  [phone]   Remember:  Do not eat food or drink liquids after midnight.  Take these medicines the morning of surgery with A SIP OF WATER   OMEPRAZOLE, LEVOTHYROXINE    (STOP ASPIRIN/ ASPIRIN PRODUCTS/ IBUPROFEN/ ADVIL/ MOTRIN, GOODY POWDERS/ BCS, HERBAL MEDICINES)   Do not wear jewelry, make-up or nail polish.  Do not wear lotions, powders, or perfumes.  You may wear deoderant.  Do not shave 48 hours prior to surgery.  Men may shave face and neck.  Do not bring valuables to the hospital.  [facility] is not responsible for any belongings or valuables.  Contacts, dentures or bridgework may not be worn into surgery.  Leave your suitcase in the car.  After surgery it may be brought to your room.  For patients admitted to the hospital, discharge time will be determined by your treatment team.  Patients discharged the day of surgery will not be allowed to drive home.   Name and phone number of your driver:    Special instructions:  [facility] - Preparing for Surgery  Before surgery, you can play an important role.  Because skin is not sterile, your skin needs to be as free of germs as possible.  You can reduce the number of germs on you skin by washing with CHG (chlorahexidine gluconate) soap before surgery.  CHG is an antiseptic cleaner which kills germs and bonds with the skin to continue killing germs even after washing.  Please DO NOT use if you have an allergy to CHG  or antibacterial soaps.  If your skin becomes reddened/irritated stop using the CHG and inform your nurse when you arrive at Short Stay.  Do not shave (including legs and underarms) for at least 48 hours prior to the first CHG shower.  You may shave your face.  Please follow these instructions carefully:   1.  Shower with CHG Soap the night before surgery and the                                morning of Surgery.  2.  If you choose to wash your hair, wash your hair first as usual with your       normal shampoo.  3.  After you shampoo, rinse your hair and body thoroughly to remove the                      Shampoo.  4.  Use CHG as you would any other liquid soap.  You can apply chg directly       to the skin and wash gently with scrungie or a clean washcloth.  5.  Apply the CHG Soap to your body ONLY FROM THE NECK DOWN.        Do not use on open wounds or open sores.  Avoid contact with your eyes,  ears, mouth and genitals (private parts).  Wash genitals (private parts)       with your normal soap.  6.  Wash thoroughly, paying special attention to the area where your surgery        will be performed.  7.  Thoroughly rinse your body with warm water from the neck down.  8.  DO NOT shower/wash with your normal soap after using and rinsing off       the CHG Soap.  9.  Pat yourself dry with a clean towel.            10.  Wear clean pajamas.            11.  Place clean sheets on your bed the night of your first shower and do not        sleep with pets.  Day of Surgery  Do not apply any lotions/deoderants the morning of surgery.  Please wear clean clothes to the hospital/surgery center.    Please read over the following fact sheets that you were given.

## 2015-11-18 NOTE — Progress Notes (Signed)
REQUESTED SLEEP STUDY FROM [facility].

## 2015-11-23 NOTE — Progress Notes (Signed)
Anesthesia Chart Review: Patient is a 60 year old female scheduled for microlaryngoscopy with right vocal cord injection Prolaryn on 11/27/15 by Dr. Wilburn Cornelia. DX: Vocal cord paralysis.   History includes non-smoker, HTN, hypercholesterolemia, GERD, PVC's (saw Dr. 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 BMI is consistent with obesity.   11/18/15 EKG: NSR, incomplete left BBB, prolonged QT (QT 441ms/QTc 476ms). Tracing appears stable when compared to tracings dating back to at least 08/09/12--also she had a low risk stress test in May 2010 with indication listed as "LBBB, Abnormal ECG (Unspecified), Atypical Chest Pain...".  08/09/12 Echo: Study Conclusions Left ventricle: The cavity size was normal. Systolic function was normal. The estimated ejection fraction was in the range of 60% to 65%. Wall motion was normal; there were no regional wall motion abnormalities.  09/12/08 Nuclear stress test (former [facility]; scanned under Media tab, Correspondence 09/21/11 encounter): Impression: ECG results: Baseline ECG with IVCD, more like left BBB.Post stress LV is normal in size. Post-stress EF 62%. No regional wall motion abnormalities. Exercise capacity 10.0 METS. No chest discomfort. This was essentially a low risk scan. Normal myocardial perfusion scan demonstrating an attenuation artifact in the anterior region of the myocardium. No ischemia or infarct/scar is seen in the remaining myocardium. If the patient has symptoms worrisome for ischemia, further evaluation could be performed with angiography.   11/12/14 CXR: IMPRESSION: Stable elevation left hemidiaphragm with left base subsegmental atelectasis noted. No acute cardiopulmonary disease noted.  Preoperative labs noted.   EKG appears stable (previous IVCB/LBBB evaluated with stress test in 2010). If no acute changes  then I anticipate that she can proceed as planned.  George Hugh [facility] Short Stay Center/Anesthesiology Phone [phone] 11/23/2015 2:55 PM

## 2015-11-26 NOTE — Anesthesia Preprocedure Evaluation (Addendum)
Anesthesia Evaluation  Patient identified by MRN, date of birth, ID band Patient awake    Reviewed: Allergy & Precautions, NPO status , Patient's Chart, lab work & pertinent test results  Airway Mallampati: II  TM Distance: >3 FB Neck ROM: Full    Dental  (+) Teeth Intact, Dental Advisory Given   Pulmonary    breath sounds clear to auscultation       Cardiovascular hypertension,  Rhythm:Regular Rate:Normal     Neuro/Psych    GI/Hepatic   Endo/Other    Renal/GU      Musculoskeletal   Abdominal   Peds  Hematology   Anesthesia Other Findings   Reproductive/Obstetrics                            Anesthesia Physical Anesthesia Plan  ASA: II  Anesthesia Plan: General   Post-op Pain Management:    Induction: Intravenous  Airway Management Planned: Oral ETT  Additional Equipment:   Intra-op Plan:   Post-operative Plan: Extubation in OR  Informed Consent: I have reviewed the patients History and Physical, chart, labs and discussed the procedure including the risks, benefits and alternatives for the proposed anesthesia with the patient or authorized representative who has indicated his/her understanding and acceptance.   Dental advisory given  Plan Discussed with: CRNA and Anesthesiologist  Anesthesia Plan Comments:        Anesthesia Quick Evaluation  

## 2015-11-27 ENCOUNTER — Encounter (HOSPITAL_COMMUNITY)
Admission: RE | Disposition: A | Discharge status: Home / Self Care | Payer: Self-pay | Source: Ambulatory Visit | Attending: Otolaryngology

## 2015-11-27 ENCOUNTER — Ambulatory Visit (HOSPITAL_COMMUNITY): Payer: BLUE CROSS/BLUE SHIELD | Admitting: Anesthesiology

## 2015-11-27 ENCOUNTER — Ambulatory Visit (HOSPITAL_COMMUNITY)
Admission: RE | Admit: 2015-11-27 | Discharge: 2015-11-27 | Disposition: A | Discharge status: Home / Self Care | Payer: BLUE CROSS/BLUE SHIELD | Source: Ambulatory Visit | Attending: Otolaryngology | Admitting: Otolaryngology

## 2015-11-27 ENCOUNTER — Encounter (HOSPITAL_COMMUNITY): Payer: Self-pay | Admitting: *Deleted

## 2015-11-27 ENCOUNTER — Ambulatory Visit (HOSPITAL_COMMUNITY): Payer: BLUE CROSS/BLUE SHIELD | Admitting: Vascular Surgery

## 2015-11-27 DIAGNOSIS — Z6835 Body mass index (BMI) 35.0-35.9, adult: Secondary | ICD-10-CM | POA: Insufficient documentation

## 2015-11-27 DIAGNOSIS — J3801 Paralysis of vocal cords and larynx, unilateral: Secondary | ICD-10-CM | POA: Diagnosis present

## 2015-11-27 DIAGNOSIS — E039 Hypothyroidism, unspecified: Secondary | ICD-10-CM | POA: Diagnosis not present

## 2015-11-27 DIAGNOSIS — Z79899 Other long term (current) drug therapy: Secondary | ICD-10-CM | POA: Insufficient documentation

## 2015-11-27 DIAGNOSIS — Z85850 Personal history of malignant neoplasm of thyroid: Secondary | ICD-10-CM | POA: Insufficient documentation

## 2015-11-27 DIAGNOSIS — Z8349 Family history of other endocrine, nutritional and metabolic diseases: Secondary | ICD-10-CM | POA: Diagnosis not present

## 2015-11-27 DIAGNOSIS — G4733 Obstructive sleep apnea (adult) (pediatric): Secondary | ICD-10-CM | POA: Insufficient documentation

## 2015-11-27 DIAGNOSIS — E7800 Pure hypercholesterolemia, unspecified: Secondary | ICD-10-CM | POA: Diagnosis not present

## 2015-11-27 DIAGNOSIS — K219 Gastro-esophageal reflux disease without esophagitis: Secondary | ICD-10-CM | POA: Insufficient documentation

## 2015-11-27 DIAGNOSIS — I10 Essential (primary) hypertension: Secondary | ICD-10-CM | POA: Insufficient documentation

## 2015-11-27 DIAGNOSIS — Z96643 Presence of artificial hip joint, bilateral: Secondary | ICD-10-CM | POA: Insufficient documentation

## 2015-11-27 DIAGNOSIS — E669 Obesity, unspecified: Secondary | ICD-10-CM | POA: Insufficient documentation

## 2015-11-27 HISTORY — PX: MICROLARYNGOSCOPY W/VOCAL CORD INJECTION: SHX2665

## 2015-11-27 SURGERY — MICROLARYNGOSCOPY, WITH VOCAL CORD INJECTION
Anesthesia: General | Laterality: Right

## 2015-11-27 MED ORDER — ONDANSETRON HCL 4 MG/2ML IJ SOLN: 4.0000 mg | Freq: Once | INTRAMUSCULAR | Status: DC | PRN

## 2015-11-27 MED ORDER — FENTANYL CITRATE (PF) 100 MCG/2ML IJ SOLN
INTRAMUSCULAR | Status: DC | PRN
  Administered 2015-11-27 (×3): 50 ug via INTRAVENOUS
  Administered 2015-11-27: 100 ug via INTRAVENOUS

## 2015-11-27 MED ORDER — ESMOLOL HCL 100 MG/10ML IV SOLN
INTRAVENOUS | Status: AC
  Filled 2015-11-27: qty 10

## 2015-11-27 MED ORDER — PROPOFOL 10 MG/ML IV BOLUS
INTRAVENOUS | Status: AC
  Filled 2015-11-27: qty 20

## 2015-11-27 MED ORDER — CHLORHEXIDINE GLUCONATE CLOTH 2 % EX PADS: 6.0000 | MEDICATED_PAD | Freq: Once | CUTANEOUS | Status: DC

## 2015-11-27 MED ORDER — FENTANYL CITRATE (PF) 250 MCG/5ML IJ SOLN
INTRAMUSCULAR | Status: AC
  Filled 2015-11-27: qty 5

## 2015-11-27 MED ORDER — MENTHOL 3 MG MT LOZG
LOZENGE | OROMUCOSAL | Status: AC
  Filled 2015-11-27: qty 9

## 2015-11-27 MED ORDER — SUGAMMADEX SODIUM 200 MG/2ML IV SOLN
INTRAVENOUS | Status: AC
  Filled 2015-11-27: qty 2

## 2015-11-27 MED ORDER — ROCURONIUM BROMIDE 100 MG/10ML IV SOLN
INTRAVENOUS | Status: DC | PRN
  Administered 2015-11-27: 40 mg via INTRAVENOUS

## 2015-11-27 MED ORDER — ONDANSETRON HCL 4 MG/2ML IJ SOLN
INTRAMUSCULAR | Status: DC | PRN
  Administered 2015-11-27: 4 mg via INTRAVENOUS

## 2015-11-27 MED ORDER — OXYCODONE HCL 5 MG PO TABS: 5.0000 mg | ORAL_TABLET | Freq: Once | ORAL | Status: DC | PRN

## 2015-11-27 MED ORDER — OXYMETAZOLINE HCL 0.05 % NA SOLN
NASAL | Status: AC
  Filled 2015-11-27: qty 15

## 2015-11-27 MED ORDER — ESMOLOL HCL 100 MG/10ML IV SOLN
INTRAVENOUS | Status: DC | PRN
  Administered 2015-11-27 (×2): 30 mg via INTRAVENOUS

## 2015-11-27 MED ORDER — EPINEPHRINE HCL (NASAL) 0.1 % NA SOLN
NASAL | Status: AC
  Filled 2015-11-27: qty 30

## 2015-11-27 MED ORDER — SUGAMMADEX SODIUM 200 MG/2ML IV SOLN
INTRAVENOUS | Status: DC | PRN
  Administered 2015-11-27: 180 mg via INTRAVENOUS

## 2015-11-27 MED ORDER — LIDOCAINE HCL 4 % EX SOLN
CUTANEOUS | Status: DC | PRN
  Administered 2015-11-27: 100 mL via TOPICAL

## 2015-11-27 MED ORDER — 0.9 % SODIUM CHLORIDE (POUR BTL) OPTIME
TOPICAL | Status: DC | PRN
  Administered 2015-11-27: 1000 mL

## 2015-11-27 MED ORDER — CEFAZOLIN SODIUM-DEXTROSE 2-4 GM/100ML-% IV SOLN
2.0000 g | INTRAVENOUS | Status: DC
  Filled 2015-11-27: qty 100

## 2015-11-27 MED ORDER — MIDAZOLAM HCL 5 MG/5ML IJ SOLN
INTRAMUSCULAR | Status: DC | PRN
  Administered 2015-11-27: 2 mg via INTRAVENOUS

## 2015-11-27 MED ORDER — LIDOCAINE HCL (CARDIAC) 20 MG/ML IV SOLN
INTRAVENOUS | Status: DC | PRN
  Administered 2015-11-27: 60 mg via INTRAVENOUS

## 2015-11-27 MED ORDER — CEFAZOLIN SODIUM-DEXTROSE 2-4 GM/100ML-% IV SOLN
2.0000 g | INTRAVENOUS | Status: AC
  Administered 2015-11-27: 2 g via INTRAVENOUS

## 2015-11-27 MED ORDER — BACITRACIN ZINC 500 UNIT/GM EX OINT
TOPICAL_OINTMENT | CUTANEOUS | Status: AC
  Filled 2015-11-27: qty 28.35

## 2015-11-27 MED ORDER — PROPOFOL 10 MG/ML IV BOLUS
INTRAVENOUS | Status: DC | PRN
  Administered 2015-11-27: 110 mg via INTRAVENOUS

## 2015-11-27 MED ORDER — FENTANYL CITRATE (PF) 100 MCG/2ML IJ SOLN: 25.0000 ug | INTRAMUSCULAR | Status: DC | PRN

## 2015-11-27 MED ORDER — OXYCODONE HCL 5 MG/5ML PO SOLN: 5.0000 mg | Freq: Once | ORAL | Status: DC | PRN

## 2015-11-27 MED ORDER — DEXAMETHASONE SODIUM PHOSPHATE 10 MG/ML IJ SOLN
10.0000 mg | Freq: Once | INTRAMUSCULAR | Status: AC
  Administered 2015-11-27: 10 mg via INTRAVENOUS

## 2015-11-27 MED ORDER — MIDAZOLAM HCL 2 MG/2ML IJ SOLN
INTRAMUSCULAR | Status: AC
  Filled 2015-11-27: qty 2

## 2015-11-27 MED ORDER — LIDOCAINE-EPINEPHRINE 1 %-1:100000 IJ SOLN
INTRAMUSCULAR | Status: AC
  Filled 2015-11-27: qty 1

## 2015-11-27 MED ORDER — DEXAMETHASONE SODIUM PHOSPHATE 10 MG/ML IJ SOLN
10.0000 mg | Freq: Once | INTRAMUSCULAR | Status: DC
  Filled 2015-11-27: qty 1

## 2015-11-27 MED ORDER — LACTATED RINGERS IV SOLN
INTRAVENOUS | Status: DC | PRN
  Administered 2015-11-27 (×2): via INTRAVENOUS

## 2015-11-27 SURGICAL SUPPLY — 27 items
CANISTER SUCTION 2500CC (MISCELLANEOUS) ×2 IMPLANT
CLEANER TIP ELECTROSURG 2X2 (MISCELLANEOUS) ×2 IMPLANT
CORDS BIPOLAR (ELECTRODE) ×2 IMPLANT
COVER MAYO STAND STRL (DRAPES) ×2 IMPLANT
COVER SURGICAL LIGHT HANDLE (MISCELLANEOUS) ×2 IMPLANT
DRAPE PROXIMA HALF (DRAPES) ×2 IMPLANT
DRESSING TELFA 8X3 (GAUZE/BANDAGES/DRESSINGS) ×1 IMPLANT
ELECT COATED BLADE 2.86 ST (ELECTRODE) ×2 IMPLANT
ELECT REM PT RETURN 9FT ADLT (ELECTROSURGICAL) ×2
ELECTRODE REM PT RTRN 9FT ADLT (ELECTROSURGICAL) ×1 IMPLANT
GLOVE BIOGEL M 7.0 STRL (GLOVE) ×2 IMPLANT
GOWN STRL REUS W/ TWL LRG LVL3 (GOWN DISPOSABLE) ×2 IMPLANT
GOWN STRL REUS W/TWL LRG LVL3 (GOWN DISPOSABLE) ×4
GUARD TEETH (MISCELLANEOUS) ×1 IMPLANT
KIT BASIN OR (CUSTOM PROCEDURE TRAY) ×2 IMPLANT
KIT PROLARN PLUS GEL W/NDL (Prosthesis and Implant ENT) ×1 IMPLANT
KIT ROOM TURNOVER OR (KITS) ×2 IMPLANT
NEEDLE 27GAX1X1/2 (NEEDLE) ×2 IMPLANT
NS IRRIG 1000ML POUR BTL (IV SOLUTION) ×2 IMPLANT
PAD ARMBOARD 7.5X6 YLW CONV (MISCELLANEOUS) ×3 IMPLANT
PENCIL BUTTON HOLSTER BLD 10FT (ELECTRODE) ×1 IMPLANT
PENCIL FOOT CONTROL (ELECTRODE) ×1 IMPLANT
RUBBERBAND STERILE (MISCELLANEOUS) ×2 IMPLANT
SOLUTION ANTI FOG 6CC (MISCELLANEOUS) ×2 IMPLANT
TOWEL OR 17X24 6PK STRL BLUE (TOWEL DISPOSABLE) ×2 IMPLANT
TRAY ENT MC OR (CUSTOM PROCEDURE TRAY) ×2 IMPLANT
TUBE CONNECTING 12X1/4 (SUCTIONS) ×2 IMPLANT

## 2015-11-27 NOTE — Brief Op Note (Signed)
11/27/2015  8:55 AM  PATIENT:  Mary Munoz  60 y.o. female  PRE-OPERATIVE DIAGNOSIS:  HOARSNESS, VOCAL CORD PARALYSIS  POST-OPERATIVE DIAGNOSIS:  HOARSNESS, VOCAL CORD PARALYSIS  PROCEDURE:  Procedure(s): MICROLARYNGOSCOPY WITH RIGHT VOCAL CORD INJECTION MEDIALIZATION  (Right)  SURGEON:  Surgeon(s) and Role:    * Jerrell Belfast, MD - Primary  PHYSICIAN ASSISTANT:   ASSISTANTS: none   ANESTHESIA:   general  EBL:   min  BLOOD ADMINISTERED:none  DRAINS: none   LOCAL MEDICATIONS USED:  NONE  SPECIMEN:  No Specimen  DISPOSITION OF SPECIMEN:  N/A  COUNTS:  YES  TOURNIQUET:  * No tourniquets in log *  DICTATION: .Other Dictation: Dictation Number [id number]  PLAN OF CARE: Discharge to home after PACU  PATIENT DISPOSITION:  PACU - hemodynamically stable.   Delay start of Pharmacological VTE agent (>24hrs) due to surgical blood loss or risk of bleeding: not applicable

## 2015-11-27 NOTE — Transfer of Care (Signed)
Immediate Anesthesia Transfer of Care Note  Patient: Mary Munoz  Procedure(s) Performed: Procedure(s): MICROLARYNGOSCOPY WITH RIGHT VOCAL CORD INJECTION MEDIALIZATION  (Right)  Patient Location: PACU  Anesthesia Type:General  Level of Consciousness: awake, alert , oriented and patient cooperative  Airway & Oxygen Therapy: Patient Spontanous Breathing and Patient connected to nasal cannula oxygen  Post-op Assessment: Report given to RN and Post -op Vital signs reviewed and stable  Post vital signs: Reviewed and stable  Last Vitals:  Filed Vitals:   11/27/15 0625  BP: 147/78  Pulse: 65  Temp: 36.6 C  Resp: 20    Last Pain: There were no vitals filed for this visit.    Patients Stated Pain Goal: 3 ([id number])  Complications: No apparent anesthesia complications

## 2015-11-27 NOTE — H&P (Signed)
Mary Munoz is an 60 y.o. female.   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 Hypertension   . 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 Hypercholesterolemia   . OSA on CPAP   . History of shingles ? 2010    NO RESIDUAL PROBLEMS FROM SHINGLES  . Thyroid nodule     RIGHT--UNDER OBSERVATION-YEARLY BLOOD WORK-NO MEDICATION  . Obesity   . Vestibular neuronitis   . Vertigo   . OSA on CPAP   . Fatigue   . Cardiac dysrhythmia, unspecified     pvc's Dr Marlou Porch evaluated 4/14- in [facility].    Marland Kitchen Hypothyroidism   . GERD (gastroesophageal reflux disease)   . Cancer [facility])     thyroid      Past Surgical History  Procedure Laterality Date  . Breast biopsy  1974    BENIGN  . Tubal ligation Bilateral 1985  . Dilation and curettage of uterus  09/21/11    D&C AND HYSTEROSCOPY FOR POSTMENOPAUSAL BLEEDING  . Total hip arthroplasty Left 08/08/2012    Procedure: TOTAL HIP ARTHROPLASTY ANTERIOR APPROACH;  Surgeon: Gearlean Alf, MD;  Location: [facility];  Service: Orthopedics;  Laterality: Left;  . Total hip arthroplasty Right 01/23/2013    Procedure: RIGHT TOTAL HIP ARTHROPLASTY ANTERIOR APPROACH;  Surgeon: Gearlean Alf, MD;  Location: [facility];  Service: Orthopedics;  Laterality: Right;  . Thyroidectomy N/A 08/13/2014    Procedure: RIGHT THYROIDECTOMY;  Surgeon: Ralene Ok, MD;  Location: [facility];  Service: General;  Laterality: N/A;    Family History  Problem Relation Age of Onset  . Dementia Mother   . Hypertension Mother   . Diabetes Mother   . Thyroid disease Mother   . CVA Mother   . Alzheimer's disease Mother   . Hypertension Father   . Heart attack Father   . Stroke Father   . Colon cancer Sister   . Thyroid disease Sister   . Thyroid disease Sister    Social History:  reports that she has never smoked. She has never used smokeless tobacco. She reports that she  does not drink alcohol or use illicit drugs.  Allergies:  Allergies  Allergen Reactions  . Citric Acid Other (See Comments)    Bladder infection  . Sulfa Antibiotics Other (See Comments)    Gets sores in mouth  . Sulfacetamide Sodium     Other reaction(s): Other (See Comments) Gets sores in mouth    Medications Prior to Admission  Medication Sig Dispense Refill  . levothyroxine (SYNTHROID, LEVOTHROID) 88 MCG tablet Take 88 mcg by mouth daily before breakfast.    . olmesartan (BENICAR) 20 MG tablet Take 20 mg by mouth every morning.    Marland Kitchen omeprazole (PRILOSEC) 40 MG capsule Take 40 mg by mouth daily.      No results found for this or any previous visit (from the past 48 hour(s)). No results found.  Review of Systems  Constitutional: Negative.   HENT: Negative.   Respiratory: Negative.   Cardiovascular: Negative.     Blood pressure 147/78, pulse 65, temperature 97.9 F (36.6 C), temperature source Oral, resp. rate 20, height 5\' 2"  (1.575 m), weight 87.68 kg (193 lb 4.8 oz), SpO2 95 %. Physical Exam  Constitutional: She appears well-developed and well-nourished.  HENT:  Right VC paralysis  Neck: Normal range of motion. Neck supple.  Cardiovascular:  Normal rate.   Respiratory: Effort normal.     Assessment/Plan Adm for OP Right VC medialization.  Mary Huffaker, MD 11/27/2015, 8:06 AM

## 2015-11-27 NOTE — Op Note (Signed)
NAME:  Mary Munoz, Mary Munoz NO.:  [id number]  MEDICAL RECORD NO.:  [id number]  LOCATION:  [facility]                         FACILITY:  [facility]  PHYSICIAN:  Early Chars. Wilburn Cornelia, M.D.DATE OF BIRTH:  [birth year]  DATE OF PROCEDURE:  11/27/2015 DATE OF DISCHARGE:                              OPERATIVE REPORT   LOCATION:  [facility] Main OR.  PREOPERATIVE DIAGNOSIS:  Right vocal cord paralysis after previous hemithyroidectomy.  POSTOPERATIVE DIAGNOSIS:  Right vocal cord paralysis after previous hemithyroidectomy.  INDICATIONS FOR SURGERY:  Right vocal cord paralysis after previous hemithyroidectomy.  SURGICAL PROCEDURE:  Microlaryngoscopy with right vocal cord injection (Prolaryn).  ANESTHESIA:  General endotracheal.  SURGEON:  Early Chars. Wilburn Cornelia, M.D.  COMPLICATIONS:  None.  BLOOD LOSS:  Minimal.  DISPOSITION:  The patient transferred from the operating room to recovery room in stable condition.  BRIEF HISTORY:  The patient is an otherwise healthy 60 year old white female who was referred for evaluation of chronic hoarseness after right hemithyroidectomy.  The patient undergone previous thyroid surgery in April, 2016 by Dr. Rosendo Gros for benign thyroid disease.  Postoperatively, the patient noted significant weakness of her voice, frequent coughing, and concerns regarding aspiration.  After an appropriate period of observation, the patient continued to have ongoing symptoms with weak raspy voice and aspiration with liquids.  Followup evaluation including flexible laryngoscopy showed a right vocal cord paralysis in a para- lateral position, enlarged posterior glottic chink.  The patient failed to recover; and given her history and findings, I recommended injection vocal cord medialization.  The risks and benefits were discussed in detail.  The patient understood and agreed with our plan for surgery which is scheduled on elective basis at [facility].  DESCRIPTION OF PROCEDURE:  The patient was brought to the operating room, placed in supine position on the operating table.  General endotracheal anesthesia was established without difficulty.  When the patient adequately anesthetized, she was positioned, prepped and draped. A surgical time-out was then performed with correct identification of the patient and the surgical procedure including the right laterality.  With the patient prepared, a Dedo laryngoscope was then inserted after placement of a maxillary tooth guard.  The laryngoscope was carefully advanced.  There was no evidence of mucosal lesion or ulcer of the larynx was visualized, and the microlaryngoscopy suspension set was positioned on a Mayo stand.  The microscope was moved into position for micro-laryngoscopy.  With excellent visualization of the patient's larynx, the Prolaryn injection was prepared.  A total of 0.5 mL was injected in the paraglottic space lateral to the vocal cord.  Separate injections were made along the length of the vocal cord with particular attention posteriorly to attempt to close the posterior glottic chink. There was no bleeding or significant swelling.  A topical lidocaine atomizer was then used to add additional anesthesia and reduce risk of postoperative coughing.  The laryngoscope was then removed.  There were no loose or broken teeth, and no bleeding.  The patient was then awakened from anesthetic.  She was extubated and transferred from the operating room to the recovery room in stable condition.  There were no complications and minimal blood loss.  ______________________________ Early Chars Wilburn Cornelia, M.D.     DLS/MEDQ  D:  [id number]  T:  11/27/2015  Job:  [id number]

## 2015-11-27 NOTE — Anesthesia Procedure Notes (Addendum)
Procedure Name: Intubation Date/Time: 11/27/2015 8:26 AM Performed by: Salli Quarry Lainie Daubert Pre-anesthesia Checklist: Patient identified, Emergency Drugs available, Suction available and Patient being monitored Patient Re-evaluated:Patient Re-evaluated prior to inductionOxygen Delivery Method: Circle System Utilized Preoxygenation: Pre-oxygenation with 100% oxygen Intubation Type: IV induction Ventilation: Mask ventilation without difficulty and Oral airway inserted - appropriate to patient size Laryngoscope Size: Mac and 3 Grade View: Grade I Tube type: Oral Tube size: 6.0 mm Number of attempts: 1 Airway Equipment and Method: Stylet and Oral airway Placement Confirmation: ETT inserted through vocal cords under direct vision,  positive ETCO2 and breath sounds checked- equal and bilateral Secured at: 20 cm Tube secured with: Tape Dental Injury: Teeth and Oropharynx as per pre-operative assessment

## 2015-11-27 NOTE — Anesthesia Postprocedure Evaluation (Signed)
Anesthesia Post Note  Patient: Mary Munoz  Procedure(s) Performed: Procedure(s) (LRB): MICROLARYNGOSCOPY WITH RIGHT VOCAL CORD INJECTION MEDIALIZATION  (Right)  Patient location during evaluation: PACU Anesthesia Type: General Level of consciousness: awake, awake and alert and oriented Pain management: pain level controlled Vital Signs Assessment: post-procedure vital signs reviewed and stable Respiratory status: spontaneous breathing, nonlabored ventilation and respiratory function stable Cardiovascular status: blood pressure returned to baseline Anesthetic complications: no    Last Vitals:  Filed Vitals:   11/27/15 1005 11/27/15 1020  BP: 145/85 152/90  Pulse: 68   Temp:    Resp: 14 16    Last Pain: There were no vitals filed for this visit.               Susan Bleich COKER

## 2015-11-29 ENCOUNTER — Encounter (HOSPITAL_COMMUNITY): Payer: Self-pay | Admitting: Otolaryngology

## 2015-12-22 ENCOUNTER — Other Ambulatory Visit: Payer: BLUE CROSS/BLUE SHIELD

## 2015-12-23 ENCOUNTER — Ambulatory Visit
Admission: RE | Admit: 2015-12-23 | Discharge: 2015-12-23 | Disposition: A | Discharge status: Home / Self Care | Payer: BLUE CROSS/BLUE SHIELD | Source: Ambulatory Visit | Attending: Endocrinology | Admitting: Endocrinology

## 2015-12-23 DIAGNOSIS — E041 Nontoxic single thyroid nodule: Secondary | ICD-10-CM

## 2016-09-28 ENCOUNTER — Other Ambulatory Visit: Payer: Self-pay | Admitting: Family Medicine

## 2016-09-28 ENCOUNTER — Encounter: Payer: Self-pay | Admitting: Speech Pathology

## 2016-09-28 ENCOUNTER — Ambulatory Visit: Payer: BLUE CROSS/BLUE SHIELD | Attending: Otolaryngology | Admitting: Speech Pathology

## 2016-09-28 DIAGNOSIS — Z1231 Encounter for screening mammogram for malignant neoplasm of breast: Secondary | ICD-10-CM

## 2016-09-28 DIAGNOSIS — R490 Dysphonia: Secondary | ICD-10-CM | POA: Diagnosis not present

## 2016-09-28 NOTE — Therapy (Signed)
[place] MAIN [facility] SERVICES [address] 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     Fatigue    GERD (gastroesophageal reflux disease)    History of shingles ? 2010   NO RESIDUAL PROBLEMS FROM SHINGLES   Hypercholesterolemia    Hypertension    Hypothyroidism    Obesity    OSA on CPAP    OSA on CPAP    Thyroid nodule    RIGHT--UNDER OBSERVATION-YEARLY BLOOD WORK-NO MEDICATION   Vertigo    Vestibular neuronitis     Past Surgical History:  Procedure Laterality Date   BREAST BIOPSY  1974   BENIGN   DILATION AND CURETTAGE OF UTERUS  09/21/11   D&C AND HYSTEROSCOPY FOR POSTMENOPAUSAL BLEEDING   MICROLARYNGOSCOPY W/VOCAL CORD INJECTION Right 11/27/2015   Procedure: MICROLARYNGOSCOPY WITH RIGHT VOCAL CORD INJECTION MEDIALIZATION;  Surgeon: Jerrell Belfast, MD;  Location: [facility] OR;  Service: ENT;  Laterality: Right;   THYROIDECTOMY N/A 08/13/2014   Procedure: RIGHT THYROIDECTOMY;  Surgeon: Ralene Ok, MD;  Location: [facility] ORS;  Service: General;  Laterality: N/A;   TOTAL HIP ARTHROPLASTY Left 08/08/2012   Procedure: TOTAL HIP ARTHROPLASTY ANTERIOR APPROACH;  Surgeon: Gearlean Alf, MD;  Location: [facility];  Service: Orthopedics;  Laterality: Left;    TOTAL HIP ARTHROPLASTY Right 01/23/2013   Procedure: RIGHT TOTAL HIP ARTHROPLASTY ANTERIOR APPROACH;  Surgeon: Gearlean Alf, MD;  Location: [facility];  Service: Orthopedics;  Laterality: Right;   TUBAL LIGATION Bilateral 1985    There were no vitals filed for this visit.          SLP Evaluation [facility] - 09/28/16 0001      SLP Visit Information   SLP Received On 09/28/16   Referring Provider Pryor Ochoa, [place]   Onset Date 08/23/2016   Medical Diagnosis Dysphonia     Subjective   Subjective "I get out of breath"   Patient/Family Stated Goal Improved vocal stamina     General Information   HPI 61 year old woman referred by Dr. Pryor Ochoa for voice therapy.  Per Care Everywhere, the patient underwent microlaryngoscopy with right vocal cord medialization using ProLaryn injection on 11/27/15.  Dr. Pryor Ochoa reports, there is good medialization of the paralyzed vocal cord with no significant glottis gap on strobe.       Prior Functional Status   Cognitive/Linguistic Baseline Within functional limits     Oral Motor/Sensory Function   Overall Oral Motor/Sensory Function Appears within functional limits for tasks assessed     Motor Speech   Overall Motor Speech Impaired   Respiration Impaired   Level of Impairment Conversation   Phonation Breathy;Hoarse;Low vocal intensity   Resonance  Within functional limits   Articulation Within functional limitis   Intelligibility Intelligible   Phonation Impaired   Vocal Abuses Habitual Cough/Throat Clear;Vocal Fold Dehydration   Tension Present Jaw;Neck;Shoulder   Volume Soft   Pitch Low     Standardized Assessments   Standardized Assessments  Other Assessment  Perceptual Voice Evaluation      Perceptual Voice Evaluation Voice checklist:  Health risks: rarely drinks water   Characteristic voice use: patient states she talks on the phone with her husbands business   Environmental risks: no significant environmental risks  Misuse:  talking without adequate breath support  Abuse: throat clearing  Vocal characteristics: breathy, hoarse, limited voice range, poor vocal projection, excessive pharyngeal resonance, vocal fatigue Maximum phonation time for sustained ah: 7 seconds Average fundamental frequency during sustained ah: 184 Hz (2.2 STD below average for age and gender) Average time patient was able to sustain /s/: 6.3 seconds Average time patient was able to sustain /z/: 5.5 seconds s/z ratio : 1.1 Visi-Pitch: Multi-Dimensional Voice Program (MDVP)  MDVP extracts objective quantitative values (Relative Average Perturbation, Shimmer, Voice Turbulence Index, and Noise to Harmonic Ratio) on sustained phonation, which are displayed graphically and numerically in comparison to a built-in normative database.  The patient exhibited values outside the norm for Relative Average Perturbation and Shimmer.  Average fundamental frequency was 2.2 STD below average for age and gender. Perceptually, her voice was muffled. The patient was able to improve all parameters with a high pitched e.      SLP Education - 09/28/16 1713    Education provided Yes   Education Details Voice therapy   Person(s) Educated Patient   Methods Explanation   Comprehension Verbalized understanding            SLP Long Term Goals - 09/28/16 1716      SLP LONG TERM GOAL #1   Title The patient will be independent for abdominal breathing and breath support exercises.   Time 8   Period Weeks   Status New     SLP LONG TERM GOAL #2   Title The patient will maximize voice quality and endurance using breath support/oral resonance for sustained vowel production, pitch glides, and hierarchal speech drill.   Time 8   Period Weeks   Status New     SLP LONG TERM GOAL #3   Title The patient will maximize voice quality and endurance using breath support/oral resonance for paragraph length recitation.   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  The patient demonstrates hoarse/breathy vocal quality, poor respiratory support for speech, excessive pharyngeal resonance, strained/tense phonation, limited pitch range, and laryngeal tension. She will benefit from voice therapy for education, to improve breath control, reduce laryngeal tension, improve tone focus, and improve vocal stamina.   Speech Therapy Frequency 2x / week   Duration Other (comment)  8 weeks   Potential to Achieve Goals Good   Potential Considerations Ability to learn/carryover information;Cooperation/participation level;Medical prognosis;Previous level of function;Severity of impairments;Family/community support   SLP Home Exercise Plan Breath support exercises   Consulted and Agree with Plan of Care Patient      Patient will benefit from skilled therapeutic intervention in order to improve the following deficits and impairments:   Dysphonia  Problem List Patient Active Problem List   Diagnosis Date Noted   S/P partial thyroidectomy 08/13/2014   Postoperative anemia due to acute blood loss 08/10/2012   OA (osteoarthritis) of hip 08/08/2012   Cardiac dysrhythmia, unspecified    Leroy Sea, MS/CCC- SLP  Lou Miner 09/28/2016, 5:24 PM  [place] MAIN [facility] SERVICES [address] [place], [place], [address] Phone: [phone]   Fax:  [phone]  Name: Mary Munoz MRN: [id number] Date of Birth: 1956/04/07

## 2016-10-04 ENCOUNTER — Ambulatory Visit: Payer: BLUE CROSS/BLUE SHIELD | Admitting: Speech Pathology

## 2016-10-04 DIAGNOSIS — R490 Dysphonia: Secondary | ICD-10-CM | POA: Diagnosis not present

## 2016-10-05 ENCOUNTER — Encounter: Payer: Self-pay | Admitting: Speech Pathology

## 2016-10-05 NOTE — Therapy (Signed)
[place] MAIN [facility] SERVICES [address] [place], [place], [address] Phone: [phone]   Fax:  [phone]  Speech Language Pathology Treatment  Patient Details  Name: Mary Munoz MRN: [id number] Date of Birth: [birth year] Referring Provider: Carloyn Manner  Encounter Date: 10/04/2016      End of Session - 10/05/16 1021    Visit Number 2   Number of Visits 17   Date for SLP Re-Evaluation 11/28/16   SLP Start Time 53   SLP Stop Time  1651   SLP Time Calculation (min) 51 min   Activity Tolerance Patient tolerated treatment well      Past Medical History:  Diagnosis Date  . 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 Cancer ([place])    thyroid    . Cardiac dysrhythmia, unspecified    pvc's Dr Marlou Porch evaluated 4/14- in [facility].    Marland Kitchen Fatigue   . GERD (gastroesophageal reflux disease)   . History of shingles ? 2010   NO RESIDUAL PROBLEMS FROM SHINGLES  . Hypercholesterolemia   . Hypertension   . Hypothyroidism   . Obesity   . OSA on CPAP   . OSA on CPAP   . Thyroid nodule    RIGHT--UNDER OBSERVATION-YEARLY BLOOD WORK-NO MEDICATION  . Vertigo   . Vestibular neuronitis     Past Surgical History:  Procedure Laterality Date  . BREAST BIOPSY  1974   BENIGN  . DILATION AND CURETTAGE OF UTERUS  09/21/11   D&C AND HYSTEROSCOPY FOR POSTMENOPAUSAL BLEEDING  . MICROLARYNGOSCOPY W/VOCAL CORD INJECTION Right 11/27/2015   Procedure: MICROLARYNGOSCOPY WITH RIGHT VOCAL CORD INJECTION MEDIALIZATION;  Surgeon: Jerrell Belfast, MD;  Location: [facility];  Service: ENT;  Laterality: Right;  . THYROIDECTOMY N/A 08/13/2014   Procedure: RIGHT THYROIDECTOMY;  Surgeon: Ralene Ok, MD;  Location: [facility];  Service: General;  Laterality: N/A;  . TOTAL HIP ARTHROPLASTY Left 08/08/2012   Procedure: TOTAL HIP ARTHROPLASTY ANTERIOR APPROACH;  Surgeon: Gearlean Alf, MD;  Location: [facility];  Service: Orthopedics;  Laterality: Left;   . TOTAL HIP ARTHROPLASTY Right 01/23/2013   Procedure: RIGHT TOTAL HIP ARTHROPLASTY ANTERIOR APPROACH;  Surgeon: Gearlean Alf, MD;  Location: [facility];  Service: Orthopedics;  Laterality: Right;  . TUBAL LIGATION Bilateral 1985    There were no vitals filed for this visit.      Subjective Assessment - 10/05/16 1020    Subjective "I'm trying to drink more water"   Currently in Pain? No/denies               ADULT SLP TREATMENT - 10/05/16 0001      General Information   Behavior/Cognition Alert;Cooperative;Pleasant mood   HPI 61 year old woman referred by Dr. Pryor Ochoa for voice therapy.  Per Care Everywhere, the patient underwent microlaryngoscopy with right vocal cord medialization using ProLaryn injection on 11/27/15.  Dr. Pryor Ochoa reports, there is good medialization of the paralyzed vocal cord with no significant glottis gap on strobe.       Treatment Provided   Treatment provided Cognitive-Linquistic     Pain Assessment   Pain Assessment No/denies pain     Cognitive-Linquistic Treatment   Treatment focused on Voice   Skilled Treatment The patient was provided with written and verbal teaching regarding neck, shoulder, tongue, and throat stretches exercises to promote relaxed phonation. The patient was provided with verbal teaching regarding vocal hygiene. She reports increase water intake and decreased habitual throat  clearing.  The patient was provided with written and verbal teaching regarding breath support exercises.  Maximum sustained /s/ is 10 seconds.  Patient reports that she is breathing deeper and having less difficulty with becoming SOB with routine activities.  Patient instructed in relaxed phonation / oral resonance. Maintains oral resonance in hum with 80% accuracy using "figure 8" strategy and initial /m/ words with 50% accuracy.     Assessment / Recommendations / Plan   Plan Continue with current plan of care     Progression Toward Goals   Progression toward  goals Progressing toward goals          SLP Education - 10/05/16 1020    Education provided Yes   Education Details Breath support, oral resonance   Person(s) Educated Patient   Methods Explanation   Comprehension Verbalized understanding            SLP Long Term Goals - 09/28/16 1716      SLP LONG TERM GOAL #1   Title The patient will be independent for abdominal breathing and breath support exercises.   Time 8   Period Weeks   Status New     SLP LONG TERM GOAL #2   Title The patient will maximize voice quality and endurance using breath support/oral resonance for sustained vowel production, pitch glides, and hierarchal speech drill.   Time 8   Period Weeks   Status New     SLP LONG TERM GOAL #3   Title The patient will maximize voice quality and endurance using breath support/oral resonance for paragraph length recitation.   Time 8   Period Weeks   Status New          Plan - 10/05/16 1021    Clinical Impression Statement  Patient able to improve vocal quality with nasality to improve oral resonance.  She reports deeper breathing and less tendency to become short of breath during routine activities.   Speech Therapy Frequency 2x / week   Duration Other (comment)   Potential to Achieve Goals Good   Potential Considerations Ability to learn/carryover information;Cooperation/participation level;Medical prognosis;Previous level of function;Severity of impairments;Family/community support   SLP Home Exercise Plan neck, shoulder, tongue, and throat stretches;  breath support exercises; relaxed phonation / oral resonance     Consulted and Agree with Plan of Care Patient      Patient will benefit from skilled therapeutic intervention in order to improve the following deficits and impairments:   Dysphonia    Problem List Patient Active Problem List   Diagnosis Date Noted  . S/P partial thyroidectomy 08/13/2014  . Postoperative anemia due to acute blood loss  08/10/2012  . OA (osteoarthritis) of hip 08/08/2012  . Cardiac dysrhythmia, unspecified    Leroy Sea, MS/CCC- SLP  Lou Miner 10/05/2016, 10:22 AM  [place] MAIN [facility] SERVICES [address] [place], [place], [address] Phone: [phone]   Fax:  [phone]   Name: Mary Munoz MRN: [id number] Date of Birth: Jan 08, 1956

## 2016-10-11 ENCOUNTER — Ambulatory Visit: Payer: BLUE CROSS/BLUE SHIELD | Admitting: Speech Pathology

## 2016-10-13 ENCOUNTER — Ambulatory Visit: Payer: BLUE CROSS/BLUE SHIELD | Admitting: Speech Pathology

## 2016-10-18 ENCOUNTER — Ambulatory Visit: Payer: BLUE CROSS/BLUE SHIELD | Admitting: Speech Pathology

## 2016-10-19 ENCOUNTER — Ambulatory Visit
Admission: RE | Admit: 2016-10-19 | Discharge: 2016-10-19 | Disposition: A | Discharge status: Home / Self Care | Payer: BLUE CROSS/BLUE SHIELD | Source: Ambulatory Visit | Attending: Family Medicine | Admitting: Family Medicine

## 2016-10-19 DIAGNOSIS — Z1231 Encounter for screening mammogram for malignant neoplasm of breast: Secondary | ICD-10-CM

## 2016-10-20 ENCOUNTER — Ambulatory Visit: Payer: BLUE CROSS/BLUE SHIELD | Admitting: Speech Pathology

## 2016-10-25 ENCOUNTER — Ambulatory Visit: Payer: BLUE CROSS/BLUE SHIELD | Admitting: Speech Pathology

## 2016-10-27 ENCOUNTER — Ambulatory Visit: Payer: BLUE CROSS/BLUE SHIELD | Admitting: Speech Pathology

## 2018-05-16 ENCOUNTER — Ambulatory Visit (INDEPENDENT_AMBULATORY_CARE_PROVIDER_SITE_OTHER): Payer: BLUE CROSS/BLUE SHIELD | Admitting: Internal Medicine

## 2018-05-16 ENCOUNTER — Encounter: Payer: Self-pay | Admitting: Internal Medicine

## 2018-05-16 VITALS — BP 136/88 | HR 70 | Temp 98.2°F | Ht 62.25 in | Wt 198.0 lb

## 2018-05-16 DIAGNOSIS — E890 Postprocedural hypothyroidism: Secondary | ICD-10-CM

## 2018-05-16 DIAGNOSIS — E039 Hypothyroidism, unspecified: Secondary | ICD-10-CM | POA: Insufficient documentation

## 2018-05-16 DIAGNOSIS — R0602 Shortness of breath: Secondary | ICD-10-CM | POA: Insufficient documentation

## 2018-05-16 DIAGNOSIS — Z0000 Encounter for general adult medical examination without abnormal findings: Secondary | ICD-10-CM | POA: Diagnosis not present

## 2018-05-16 LAB — COMPREHENSIVE METABOLIC PANEL
ALT: 31 U/L (ref 0–35)
AST: 23 U/L (ref 0–37)
Albumin: 4.4 g/dL (ref 3.5–5.2)
Alkaline Phosphatase: 117 U/L (ref 39–117)
BUN: 12 mg/dL (ref 6–23)
CO2: 27 mEq/L (ref 19–32)
Calcium: 9.6 mg/dL (ref 8.4–10.5)
Chloride: 105 mEq/L (ref 96–112)
Creatinine, Ser: 0.84 mg/dL (ref 0.40–1.20)
GFR: 72.53 mL/min (ref >60.00)
Glucose, Bld: 90 mg/dL (ref 70–99)
POTASSIUM: 4.5 meq/L (ref 3.5–5.1)
Sodium: 139 mEq/L (ref 135–145)
Total Bilirubin: 0.5 mg/dL (ref 0.2–1.2)
Total Protein: 7.3 g/dL (ref 6.0–8.3)

## 2018-05-16 LAB — CBC
HCT: 44 % (ref 36.0–46.0)
Hemoglobin: 14.9 g/dL (ref 12.0–15.0)
MCHC: 33.8 g/dL (ref 30.0–36.0)
MCV: 88.2 fl (ref 78.0–100.0)
Platelets: 306 10*3/uL (ref 150.0–400.0)
RBC: 4.98 Mil/uL (ref 3.87–5.11)
RDW: 14.1 % (ref 11.5–15.5)
WBC: 7.1 10*3/uL (ref 4.0–10.5)

## 2018-05-16 LAB — T4, FREE: FREE T4: 0.92 ng/dL (ref 0.60–1.60)

## 2018-05-16 LAB — TSH: TSH: 4.39 u[IU]/mL (ref 0.35–4.50)

## 2018-05-16 NOTE — Patient Instructions (Signed)
DASH Eating Plan  DASH stands for "Dietary Approaches to Stop Hypertension." The DASH eating plan is a healthy eating plan that has been shown to reduce high blood pressure (hypertension). It may also reduce your risk for type 2 diabetes, heart disease, and stroke. The DASH eating plan may also help with weight loss.  What are tips for following this plan?    General guidelines   Avoid eating more than 2,300 mg (milligrams) of salt (sodium) a day. If you have hypertension, you may need to reduce your sodium intake to 1,500 mg a day.   Limit alcohol intake to no more than 1 drink a day for nonpregnant women and 2 drinks a day for men. One drink equals 12 oz of beer, 5 oz of wine, or 1 oz of hard liquor.   Work with your health care provider to maintain a healthy body weight or to lose weight. Ask what an ideal weight is for you.   Get at least 30 minutes of exercise that causes your heart to beat faster (aerobic exercise) most days of the week. Activities may include walking, swimming, or biking.   Work with your health care provider or diet and nutrition specialist (dietitian) to adjust your eating plan to your individual calorie needs.  Reading food labels     Check food labels for the amount of sodium per serving. Choose foods with less than 5 percent of the Daily Value of sodium. Generally, foods with less than 300 mg of sodium per serving fit into this eating plan.   To find whole grains, look for the word "whole" as the first word in the ingredient list.  Shopping   Buy products labeled as "low-sodium" or "no salt added."   Buy fresh foods. Avoid canned foods and premade or frozen meals.  Cooking   Avoid adding salt when cooking. Use salt-free seasonings or herbs instead of table salt or sea salt. Check with your health care provider or pharmacist before using salt substitutes.   Do not fry foods. Cook foods using healthy methods such as baking, boiling, grilling, and broiling instead.   Cook with  heart-healthy oils, such as olive, canola, soybean, or sunflower oil.  Meal planning   Eat a balanced diet that includes:  ? 5 or more servings of fruits and vegetables each day. At each meal, try to fill half of your plate with fruits and vegetables.  ? Up to 6-8 servings of whole grains each day.  ? Less than 6 oz of lean meat, poultry, or fish each day. A 3-oz serving of meat is about the same size as a deck of cards. One egg equals 1 oz.  ? 2 servings of low-fat dairy each day.  ? A serving of nuts, seeds, or beans 5 times each week.  ? Heart-healthy fats. Healthy fats called Omega-3 fatty acids are found in foods such as flaxseeds and coldwater fish, like sardines, salmon, and mackerel.   Limit how much you eat of the following:  ? Canned or prepackaged foods.  ? Food that is high in trans fat, such as fried foods.  ? Food that is high in saturated fat, such as fatty meat.  ? Sweets, desserts, sugary drinks, and other foods with added sugar.  ? Full-fat dairy products.   Do not salt foods before eating.   Try to eat at least 2 vegetarian meals each week.   Eat more home-cooked food and less restaurant, buffet, and fast food.     When eating at a restaurant, ask that your food be prepared with less salt or no salt, if possible.  What foods are recommended?  The items listed may not be a complete list. Talk with your dietitian about what dietary choices are best for you.  Grains  Whole-grain or whole-wheat bread. Whole-grain or whole-wheat pasta. Brown rice. Oatmeal. Quinoa. Bulgur. Whole-grain and low-sodium cereals. Pita bread. Low-fat, low-sodium crackers. Whole-wheat flour tortillas.  Vegetables  Fresh or frozen vegetables (raw, steamed, roasted, or grilled). Low-sodium or reduced-sodium tomato and vegetable juice. Low-sodium or reduced-sodium tomato sauce and tomato paste. Low-sodium or reduced-sodium canned vegetables.  Fruits  All fresh, dried, or frozen fruit. Canned fruit in natural juice (without  added sugar).  Meat and other protein foods  Skinless chicken or turkey. Ground chicken or turkey. Pork with fat trimmed off. Fish and seafood. Egg whites. Dried beans, peas, or lentils. Unsalted nuts, nut butters, and seeds. Unsalted canned beans. Lean cuts of beef with fat trimmed off. Low-sodium, lean deli meat.  Dairy  Low-fat (1%) or fat-free (skim) milk. Fat-free, low-fat, or reduced-fat cheeses. Nonfat, low-sodium ricotta or cottage cheese. Low-fat or nonfat yogurt. Low-fat, low-sodium cheese.  Fats and oils  Soft margarine without trans fats. Vegetable oil. Low-fat, reduced-fat, or light mayonnaise and salad dressings (reduced-sodium). Canola, safflower, olive, soybean, and sunflower oils. Avocado.  Seasoning and other foods  Herbs. Spices. Seasoning mixes without salt. Unsalted popcorn and pretzels. Fat-free sweets.  What foods are not recommended?  The items listed may not be a complete list. Talk with your dietitian about what dietary choices are best for you.  Grains  Baked goods made with fat, such as croissants, muffins, or some breads. Dry pasta or rice meal packs.  Vegetables  Creamed or fried vegetables. Vegetables in a cheese sauce. Regular canned vegetables (not low-sodium or reduced-sodium). Regular canned tomato sauce and paste (not low-sodium or reduced-sodium). Regular tomato and vegetable juice (not low-sodium or reduced-sodium). Pickles. Olives.  Fruits  Canned fruit in a light or heavy syrup. Fried fruit. Fruit in cream or butter sauce.  Meat and other protein foods  Fatty cuts of meat. Ribs. Fried meat. Bacon. Sausage. Bologna and other processed lunch meats. Salami. Fatback. Hotdogs. Bratwurst. Salted nuts and seeds. Canned beans with added salt. Canned or smoked fish. Whole eggs or egg yolks. Chicken or turkey with skin.  Dairy  Whole or 2% milk, cream, and half-and-half. Whole or full-fat cream cheese. Whole-fat or sweetened yogurt. Full-fat cheese. Nondairy creamers. Whipped toppings.  Processed cheese and cheese spreads.  Fats and oils  Butter. Stick margarine. Lard. Shortening. Ghee. Bacon fat. Tropical oils, such as coconut, palm kernel, or palm oil.  Seasoning and other foods  Salted popcorn and pretzels. Onion salt, garlic salt, seasoned salt, table salt, and sea salt. Worcestershire sauce. Tartar sauce. Barbecue sauce. Teriyaki sauce. Soy sauce, including reduced-sodium. Steak sauce. Canned and packaged gravies. Fish sauce. Oyster sauce. Cocktail sauce. Horseradish that you find on the shelf. Ketchup. Mustard. Meat flavorings and tenderizers. Bouillon cubes. Hot sauce and Tabasco sauce. Premade or packaged marinades. Premade or packaged taco seasonings. Relishes. Regular salad dressings.  Where to find more information:   National Heart, Lung, and Blood Institute: www.nhlbi.nih.gov   American Heart Association: www.heart.org  Summary   The DASH eating plan is a healthy eating plan that has been shown to reduce high blood pressure (hypertension). It may also reduce your risk for type 2 diabetes, heart disease, and stroke.   With the   DASH eating plan, you should limit salt (sodium) intake to 2,300 mg a day. If you have hypertension, you may need to reduce your sodium intake to 1,500 mg a day.   When on the DASH eating plan, aim to eat more fresh fruits and vegetables, whole grains, lean proteins, low-fat dairy, and heart-healthy fats.   Work with your health care provider or diet and nutrition specialist (dietitian) to adjust your eating plan to your individual calorie needs.  This information is not intended to replace advice given to you by your health care provider. Make sure you discuss any questions you have with your health care provider.  Document Released: 04/14/2011 Document Revised: 04/18/2016 Document Reviewed: 04/18/2016  Elsevier Interactive Patient Education  2019 [employer]

## 2018-05-16 NOTE — Assessment & Plan Note (Signed)
Healthy Due for mammogram in the next few months Colon due 8/11 UTD on imms---will check records One last pap next year

## 2018-05-16 NOTE — Progress Notes (Signed)
Subjective:    Patient ID: Mary Munoz, female    DOB: [birth year], 63 y.o.   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 levothyroxine (SYNTHROID, LEVOTHROID) 88 MCG tablet Take 88 mcg by mouth daily before breakfast.    . olmesartan (BENICAR) 20 MG tablet Take 20 mg by mouth every morning.     No current facility-administered medications on file prior to visit.     Allergies  Allergen Reactions  . Citric Acid Other (See Comments)    Bladder infection  . Sulfa Antibiotics Other (See Comments)    Gets sores in mouth  . Sulfacetamide Sodium     Other reaction(s): Other (See Comments) Gets sores in mouth    Past Medical History:  Diagnosis Date  . 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 Cancer ([place])    thyroid    . Cardiac dysrhythmia, unspecified    pvc's Dr Marlou Porch evaluated 4/14- in [facility].    Marland Kitchen GERD (gastroesophageal reflux disease)   . History of shingles ? 2010   NO RESIDUAL PROBLEMS FROM SHINGLES  . Hypercholesterolemia   . Hypertension   . Hypothyroidism   . Obesity   . OSA on CPAP   . Vertigo   . Vestibular neuronitis     Past Surgical History:  Procedure Laterality Date  . BREAST BIOPSY  1974   BENIGN  . DILATION AND CURETTAGE OF UTERUS  09/21/11   D&C AND HYSTEROSCOPY FOR POSTMENOPAUSAL BLEEDING  . MICROLARYNGOSCOPY W/VOCAL CORD INJECTION Right 11/27/2015   Procedure: MICROLARYNGOSCOPY WITH RIGHT VOCAL CORD INJECTION MEDIALIZATION;   Surgeon: Jerrell Belfast, MD;  Location: [facility];  Service: ENT;  Laterality: Right;  . THYROIDECTOMY N/A 08/13/2014   Procedure: RIGHT THYROIDECTOMY;  Surgeon: Ralene Ok, MD;  Location: [facility];  Service: General;  Laterality: N/A;  . TOTAL HIP ARTHROPLASTY Left 08/08/2012   Procedure: TOTAL HIP ARTHROPLASTY ANTERIOR APPROACH;  Surgeon: Gearlean Alf, MD;  Location: [facility];  Service: Orthopedics;  Laterality: Left;  . TOTAL HIP ARTHROPLASTY Right 01/23/2013   Procedure: RIGHT TOTAL HIP ARTHROPLASTY ANTERIOR APPROACH;  Surgeon: Gearlean Alf, MD;  Location: [facility];  Service: Orthopedics;  Laterality: Right;  . TUBAL LIGATION Bilateral 1985    Family History  Problem Relation Age of Onset  . Dementia Mother   . Hypertension Mother   . Diabetes Mother   . Thyroid disease Mother   . CVA Mother   . Alzheimer's disease Mother   . Hypertension Father   . Heart attack Father   . Stroke Father   . Colon cancer Sister   . Thyroid disease Sister   . Thyroid disease Sister     Social History   Socioeconomic History  . Marital status: Married    Spouse name: Not on file  . Number of children: 1  . Years of education: college  . Highest education level: Not on file  Occupational History  . Occupation: Bookkeeper/Payroll    Employer: [employer]  Social Needs  .  Financial resource strain: Not on file  . Food insecurity:    Worry: Not on file    Inability: Not on file  . Transportation needs:    Medical: Not on file    Non-medical: Not on file  Tobacco Use  . Smoking status: Never Smoker  . Smokeless tobacco: Never Used  Substance and Sexual Activity  . Alcohol use: No  . Drug use: No  . Sexual activity: Yes    Partners: Male    Birth control/protection: Post-menopausal, Surgical    Comment: BTL  Lifestyle  . Physical activity:    Days per week: Not on file    Minutes per session: Not on file  . Stress: Not on file  Relationships  . Social connections:    Talks on  phone: Not on file    Gets together: Not on file    Attends religious service: Not on file    Active member of club or organization: Not on file    Attends meetings of clubs or organizations: Not on file    Relationship status: Not on file  . Intimate partner violence:    Fear of current or ex partner: Not on file    Emotionally abused: Not on file    Physically abused: Not on file    Forced sexual activity: Not on file  Other Topics Concern  . Not on file  Social History Narrative   Married   Right handed   Caffeine use- 1 cup coffee daily, occasional tea      No living will   Husband to be decision maker   Review of Systems  Constitutional: Negative for fatigue and unexpected weight change.       Wears seat belt  HENT: Positive for trouble swallowing and voice change. Negative for dental problem, hearing loss and tinnitus.        Keeps up with dentist  Eyes: Negative for visual disturbance.       No diplopia or unilateral vision loss  Respiratory: Positive for shortness of breath. Negative for cough and chest tightness.   Cardiovascular: Negative for chest pain, palpitations and leg swelling.  Gastrointestinal: Negative for blood in stool and constipation.       No heartburn  Endocrine: Negative for polydipsia and polyuria.  Genitourinary: Negative for difficulty urinating, dyspareunia, dysuria and hematuria.  Musculoskeletal: Negative for arthralgias, back pain and joint swelling.  Skin: Negative for rash.  Allergic/Immunologic: Negative for environmental allergies and immunocompromised state.  Neurological: Negative for dizziness, syncope, light-headedness and headaches.  Hematological: Negative for adenopathy. Does not bruise/bleed easily.  Psychiatric/Behavioral: Negative for dysphoric mood and sleep disturbance. The patient is not nervous/anxious.        Objective:   Physical Exam  Constitutional: She is oriented to person, place, and time. She appears  well-developed. No distress.  HENT:  Head: Normocephalic and atraumatic.  Right Ear: External ear normal.  Left Ear: External ear normal.  Mouth/Throat: Oropharynx is clear and moist. No oropharyngeal exudate.  Eyes: Pupils are equal, round, and reactive to light. Conjunctivae are normal.  Neck: Normal range of motion. No thyromegaly present.  Cardiovascular: Normal rate, regular rhythm, normal heart sounds and intact distal pulses. Exam reveals no gallop.  No murmur heard. Respiratory: Effort normal and breath sounds normal. No respiratory distress. She has no wheezes. She has no rales.  GI: Soft. There is no abdominal tenderness.  Musculoskeletal:        General: No tenderness or edema.  Lymphadenopathy:    She has no cervical adenopathy.  Neurological: She is alert and oriented to person, place, and time.  Skin: No rash noted. No erythema.  Psychiatric: She has a normal mood and affect. Her behavior is normal.           Assessment & Plan:

## 2018-05-16 NOTE — Assessment & Plan Note (Signed)
Vocal cord damage from the thyroidectomy Handicapped form done Discussed resistance exercise

## 2018-05-16 NOTE — Assessment & Plan Note (Signed)
Will check labs

## 2018-06-01 ENCOUNTER — Encounter: Payer: Self-pay | Admitting: Internal Medicine

## 2018-06-04 ENCOUNTER — Encounter: Payer: Self-pay | Admitting: Internal Medicine

## 2018-07-03 ENCOUNTER — Encounter: Payer: Self-pay | Admitting: Internal Medicine

## 2018-07-03 ENCOUNTER — Ambulatory Visit (INDEPENDENT_AMBULATORY_CARE_PROVIDER_SITE_OTHER): Payer: BLUE CROSS/BLUE SHIELD | Admitting: Internal Medicine

## 2018-07-03 VITALS — BP 140/84 | HR 76 | Temp 98.2°F | Wt 197.0 lb

## 2018-07-03 DIAGNOSIS — R3915 Urgency of urination: Secondary | ICD-10-CM | POA: Diagnosis not present

## 2018-07-03 DIAGNOSIS — R350 Frequency of micturition: Secondary | ICD-10-CM | POA: Diagnosis not present

## 2018-07-03 DIAGNOSIS — R300 Dysuria: Secondary | ICD-10-CM

## 2018-07-03 LAB — POC URINALSYSI DIPSTICK (AUTOMATED)
Glucose, UA: NEGATIVE
Ketones, UA: NEGATIVE
Nitrite, UA: POSITIVE
Protein, UA: NEGATIVE
RBC UA: NEGATIVE
Spec Grav, UA: 1.015 (ref 1.010–1.025)
Urobilinogen, UA: 1 E.U./dL
pH, UA: 5.5 (ref 5.0–8.0)

## 2018-07-03 MED ORDER — CIPROFLOXACIN HCL 500 MG PO TABS: 500.0000 mg | ORAL_TABLET | Freq: Two times a day (BID) | ORAL | 0 refills | Status: DC

## 2018-07-03 NOTE — Addendum Note (Signed)
Addended by: Lurlean Nanny on: 07/03/2018 04:34 PM   Modules accepted: Orders

## 2018-07-03 NOTE — Progress Notes (Signed)
HPI  Pt presents to the clinic today with c/o urinary urgency, frequency and dysuria. She reports this started about 3 days ago. She denies blood in her urine, vaginal discharge, odor, irritation or abnormal bleeding. She denies fever, chills, nausea or low back pain. She has tried AZO OTC with minimal relief.   Review of Systems  Past Medical History:  Diagnosis Date  . 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 Cancer ([place])    thyroid    . Cardiac dysrhythmia, unspecified    pvc's Dr Marlou Porch evaluated 4/14- in [facility].    Marland Kitchen GERD (gastroesophageal reflux disease)   . History of shingles ? 2010   NO RESIDUAL PROBLEMS FROM SHINGLES  . Hypercholesterolemia   . Hypertension   . Hypothyroidism   . Obesity   . OSA on CPAP   . Vertigo   . Vestibular neuronitis     Family History  Problem Relation Age of Onset  . Dementia Mother   . Hypertension Mother   . Diabetes Mother   . Thyroid disease Mother   . CVA Mother   . Alzheimer's disease Mother   . Hypertension Father   . Heart attack Father   . Stroke Father   . Colon cancer Sister   . Thyroid disease Sister   . Heart disease Sister        CABG  . Thyroid disease Sister   . Cancer Neg Hx     Social History   Socioeconomic History  . Marital status: Married    Spouse name: Not on file  . Number of children: 1  . Years of education: college  . Highest education level: Not on file  Occupational History  . Occupation: Bookkeeper/Payroll    Employer: [employer]  Social Needs  . Financial resource strain: Not on file  . Food insecurity:    Worry: Not on file    Inability: Not on file  . Transportation needs:    Medical: Not on file    Non-medical: Not on file  Tobacco Use  . Smoking status: Never Smoker  . Smokeless tobacco: Never Used  Substance and Sexual Activity  . Alcohol use: No  . Drug use: No  . Sexual activity: Yes    Partners: Male    Birth control/protection: Post-menopausal,  Surgical    Comment: BTL  Lifestyle  . Physical activity:    Days per week: Not on file    Minutes per session: Not on file  . Stress: Not on file  Relationships  . Social connections:    Talks on phone: Not on file    Gets together: Not on file    Attends religious service: Not on file    Active member of club or organization: Not on file    Attends meetings of clubs or organizations: Not on file    Relationship status: Not on file  . Intimate partner violence:    Fear of current or ex partner: Not on file    Emotionally abused: Not on file    Physically abused: Not on file    Forced sexual activity: Not on file  Other Topics Concern  . Not on file  Social History Narrative   Married   Right handed   Caffeine use- 1 cup coffee daily, occasional tea      No living will   Husband to be decision maker    Allergies  Allergen Reactions  . Citric Acid  Other (See Comments)    Bladder infection  . Sulfa Antibiotics Other (See Comments)    Gets sores in mouth  . Sulfacetamide Sodium     Other reaction(s): Other (See Comments) Gets sores in mouth     Constitutional: Denies fever, malaise, fatigue, headache or abrupt weight changes.   GU: Pt reports urgency, frequency and pain with urination. Denies burning sensation, blood in urine, odor or discharge. Skin: Denies redness, rashes, lesions or ulcercations.   No other specific complaints in a complete review of systems (except as listed in HPI above).    Objective:   Physical Exam  BP 140/84   Pulse 76   Temp 98.2 F (36.8 C) (Oral)   Wt 197 lb (89.4 kg)   SpO2 98%   BMI 35.74 kg/m  Wt Readings from Last 3 Encounters:  07/03/18 197 lb (89.4 kg)  05/16/18 198 lb (89.8 kg)  11/27/15 193 lb 4.8 oz (87.7 kg)    General: Appears her stated age, in NAD. Abdomen: Soft. Normal bowel sounds. No distention or masses noted.  Tender to palpation over the bladder area. No CVA tenderness.        Assessment & Plan:    Urgency, Frequency, Dysuria secondary to Possible UTI  Urinalysis: 3+ leuks Will send urine culture eRx sent if for Cipro 500 mg BID x 5 days OK to take AZO OTC Drink plenty of fluids  RTC as needed or if symptoms persist. Webb Silversmith, NP

## 2018-07-03 NOTE — Patient Instructions (Signed)

## 2018-07-05 LAB — URINE CULTURE
MICRO NUMBER:: 239756
SPECIMEN QUALITY:: ADEQUATE

## 2018-07-17 ENCOUNTER — Other Ambulatory Visit: Payer: Self-pay | Admitting: Internal Medicine

## 2018-07-17 ENCOUNTER — Telehealth: Payer: Self-pay

## 2018-07-17 NOTE — Telephone Encounter (Signed)
Pt last seen 07/03/18; pt finished abx and symptoms went away. Pt ate several jelly donuts on 07/15/18 and pt found out donuts have citric acid in them and pt thinks that is what caused symptom of urgency of urine to return. Pt also having perineal itching. Pt voiding good amts. No burning upon urination and no fever. Pt request refill of cipro or different abx.Pt said difficult to schedule appt due to work schedule. [place].Please advise.

## 2018-07-17 NOTE — Telephone Encounter (Signed)
She needs to be seen or cando E visit

## 2018-07-18 ENCOUNTER — Encounter: Payer: Self-pay | Admitting: Internal Medicine

## 2018-07-18 ENCOUNTER — Ambulatory Visit (INDEPENDENT_AMBULATORY_CARE_PROVIDER_SITE_OTHER): Payer: BLUE CROSS/BLUE SHIELD | Admitting: Internal Medicine

## 2018-07-18 ENCOUNTER — Other Ambulatory Visit: Payer: Self-pay

## 2018-07-18 VITALS — BP 134/84 | HR 90 | Temp 98.7°F | Wt 197.0 lb

## 2018-07-18 DIAGNOSIS — L293 Anogenital pruritus, unspecified: Secondary | ICD-10-CM

## 2018-07-18 DIAGNOSIS — R3915 Urgency of urination: Secondary | ICD-10-CM | POA: Diagnosis not present

## 2018-07-18 LAB — POC URINALSYSI DIPSTICK (AUTOMATED)
Blood, UA: NEGATIVE
GLUCOSE UA: POSITIVE — AB
Nitrite, UA: POSITIVE
Protein, UA: POSITIVE — AB
Spec Grav, UA: 1.025 (ref 1.010–1.025)
Urobilinogen, UA: 2 E.U./dL — AB
pH, UA: 5 (ref 5.0–8.0)

## 2018-07-18 MED ORDER — FLUCONAZOLE 150 MG PO TABS: 150.0000 mg | ORAL_TABLET | Freq: Once | ORAL | 1 refills | Status: AC

## 2018-07-18 MED ORDER — CIPROFLOXACIN HCL 500 MG PO TABS: 500.0000 mg | ORAL_TABLET | Freq: Two times a day (BID) | ORAL | 0 refills | Status: DC

## 2018-07-18 NOTE — Patient Instructions (Signed)
Urinary Tract Infection, Adult A urinary tract infection (UTI) is an infection of any part of the urinary tract. The urinary tract includes:  The kidneys.  The ureters.  The bladder.  The urethra. These organs make, store, and get rid of pee (urine) in the body. What are the causes? This is caused by germs (bacteria) in your genital area. These germs grow and cause swelling (inflammation) of your urinary tract. What increases the risk? You are more likely to develop this condition if:  You have a small, thin tube (catheter) to drain pee.  You cannot control when you pee or poop (incontinence).  You are female, and: ? You use these methods to prevent pregnancy: ? A medicine that kills sperm (spermicide). ? A device that blocks sperm (diaphragm). ? You have low levels of a female hormone (estrogen). ? You are pregnant.  You have genes that add to your risk.  You are sexually active.  You take antibiotic medicines.  You have trouble peeing because of: ? A prostate that is bigger than normal, if you are female. ? A blockage in the part of your body that drains pee from the bladder (urethra). ? A kidney stone. ? A nerve condition that affects your bladder (neurogenic bladder). ? Not getting enough to drink. ? Not peeing often enough.  You have other conditions, such as: ? Diabetes. ? A weak disease-fighting system (immune system). ? Sickle cell disease. ? Gout. ? Injury of the spine. What are the signs or symptoms? Symptoms of this condition include:  Needing to pee right away (urgently).  Peeing often.  Peeing small amounts often.  Pain or burning when peeing.  Blood in the pee.  Pee that smells bad or not like normal.  Trouble peeing.  Pee that is cloudy.  Fluid coming from the vagina, if you are female.  Pain in the belly or lower back. Other symptoms include:  Throwing up (vomiting).  No urge to eat.  Feeling mixed up (confused).  Being tired  and grouchy (irritable).  A fever.  Watery poop (diarrhea). How is this treated? This condition may be treated with:  Antibiotic medicine.  Other medicines.  Drinking enough water. Follow these instructions at home:  Medicines  Take over-the-counter and prescription medicines only as told by your doctor.  If you were prescribed an antibiotic medicine, take it as told by your doctor. Do not stop taking it even if you start to feel better. General instructions  Make sure you: ? Pee until your bladder is empty. ? Do not hold pee for a long time. ? Empty your bladder after sex. ? Wipe from front to back after pooping if you are a female. Use each tissue one time when you wipe.  Drink enough fluid to keep your pee pale yellow.  Keep all follow-up visits as told by your doctor. This is important. Contact a doctor if:  You do not get better after 1-2 days.  Your symptoms go away and then come back. Get help right away if:  You have very bad back pain.  You have very bad pain in your lower belly.  You have a fever.  You are sick to your stomach (nauseous).  You are throwing up. Summary  A urinary tract infection (UTI) is an infection of any part of the urinary tract.  This condition is caused by germs in your genital area.  There are many risk factors for a UTI. These include having a small, thin   tube to drain pee and not being able to control when you pee or poop.  Treatment includes antibiotic medicines for germs.  Drink enough fluid to keep your pee pale yellow. This information is not intended to replace advice given to you by your health care provider. Make sure you discuss any questions you have with your health care provider. Document Released: 10/12/2007 Document Revised: 11/02/2017 Document Reviewed: 11/02/2017 Elsevier Interactive Patient Education  2019 Elsevier Inc.  

## 2018-07-18 NOTE — Telephone Encounter (Signed)
Patient called stating that she called for a refill yesterday and has not heard anything back about the refill. Advised patient what Webb Silversmith NP stated and she wanted to know why she had not been called back yet? Advised patient that I can schedule her an appointment now. Appointment scheduled today with Webb Silversmith NP.

## 2018-07-18 NOTE — Progress Notes (Signed)
HPI  Pt presents to the clinic today with c/o urinary urgency and vaginal itching. This started yesterday. She denies frequency, dysuria or blood in her urine. She denies vaginal discharge, odor or abnormal bleeding. She denies fever, chills or body aches. She has tried AZO with some relief. She was treated for a UTI 2/25 with Cipro x 5 days.    Review of Systems  Past Medical History:  Diagnosis Date  . 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 Cancer ([place])    thyroid    . Cardiac dysrhythmia, unspecified    pvc's Dr Marlou Porch evaluated 4/14- in [facility].    Marland Kitchen GERD (gastroesophageal reflux disease)   . History of shingles ? 2010   NO RESIDUAL PROBLEMS FROM SHINGLES  . Hypercholesterolemia   . Hypertension   . Hypothyroidism   . Obesity   . OSA on CPAP   . Vertigo   . Vestibular neuronitis     Family History  Problem Relation Age of Onset  . Dementia Mother   . Hypertension Mother   . Diabetes Mother   . Thyroid disease Mother   . CVA Mother   . Alzheimer's disease Mother   . Hypertension Father   . Heart attack Father   . Stroke Father   . Colon cancer Sister   . Thyroid disease Sister   . Heart disease Sister        CABG  . Thyroid disease Sister   . Cancer Neg Hx     Social History   Socioeconomic History  . Marital status: Married    Spouse name: Not on file  . Number of children: 1  . Years of education: college  . Highest education level: Not on file  Occupational History  . Occupation: Bookkeeper/Payroll    Employer: [employer]  Social Needs  . Financial resource strain: Not on file  . Food insecurity:    Worry: Not on file    Inability: Not on file  . Transportation needs:    Medical: Not on file    Non-medical: Not on file  Tobacco Use  . Smoking status: Never Smoker  . Smokeless tobacco: Never Used  Substance and Sexual Activity  . Alcohol use: No  . Drug use: No  . Sexual activity: Yes    Partners: Male    Birth  control/protection: Post-menopausal, Surgical    Comment: BTL  Lifestyle  . Physical activity:    Days per week: Not on file    Minutes per session: Not on file  . Stress: Not on file  Relationships  . Social connections:    Talks on phone: Not on file    Gets together: Not on file    Attends religious service: Not on file    Active member of club or organization: Not on file    Attends meetings of clubs or organizations: Not on file    Relationship status: Not on file  . Intimate partner violence:    Fear of current or ex partner: Not on file    Emotionally abused: Not on file    Physically abused: Not on file    Forced sexual activity: Not on file  Other Topics Concern  . Not on file  Social History Narrative   Married   Right handed   Caffeine use- 1 cup coffee daily, occasional tea      No living will   Husband to be Media planner  Allergies  Allergen Reactions  . Citric Acid Other (See Comments)    Bladder infection  . Sulfa Antibiotics Other (See Comments)    Gets sores in mouth  . Sulfacetamide Sodium     Other reaction(s): Other (See Comments) Gets sores in mouth     Constitutional: Denies fever, malaise, fatigue, headache or abrupt weight changes.   GU: Pt reports urgency, and perineal irritation. Denies frequency, dysuria, burning sensation, blood in urine, odor or discharge. Skin: Denies redness, rashes, lesions or ulcercations.   No other specific complaints in a complete review of systems (except as listed in HPI above).    Objective:   Physical Exam  There were no vitals taken for this visit. Wt Readings from Last 3 Encounters:  07/03/18 197 lb (89.4 kg)  05/16/18 198 lb (89.8 kg)  11/27/15 193 lb 4.8 oz (87.7 kg)    General: Appears her stated age, obese, in NAD. Cardiovascular: Normal rate and rhythm. S1,S2 noted.   Pulmonary/Chest: Normal effort and positive vesicular breath sounds. No respiratory distress. No wheezes, rales or ronchi  noted.  Abdomen: Soft. Normal bowel sounds. No distention or masses noted.  Tender to palpation over the bladder area. No CVA tenderness.        Assessment & Plan:   Urgency, Perineal Itching:  Urinalysis: pos leuks, trace bloods Will send urine culture Will send off wet prep OK to take AZO OTC Drink plenty of fluids  RTC as needed or if symptoms persist. Webb Silversmith, NP

## 2018-07-18 NOTE — Addendum Note (Signed)
Addended by: Lurlean Nanny on: 07/18/2018 04:40 PM   Modules accepted: Orders

## 2018-07-18 NOTE — Telephone Encounter (Signed)
Pt has scheduled appt.

## 2018-07-19 LAB — WET PREP BY MOLECULAR PROBE
Candida species: NOT DETECTED
Gardnerella vaginalis: NOT DETECTED
MICRO NUMBER:: 305913
SPECIMEN QUALITY:: ADEQUATE
Trichomonas vaginosis: NOT DETECTED

## 2018-07-19 LAB — URINE CULTURE
MICRO NUMBER:: 305914
Result:: NO GROWTH
SPECIMEN QUALITY: ADEQUATE

## 2018-07-23 ENCOUNTER — Telehealth: Payer: Self-pay | Admitting: *Deleted

## 2018-07-23 NOTE — Telephone Encounter (Signed)
Her last culture was negative. She does not have a bacterial infection. She needs to follow up with PCP if concerned.

## 2018-07-23 NOTE — Telephone Encounter (Signed)
Patient left a voicemail stating that she has been seen several times for burning, itching and urine urgency. Patient stated that she is still having problems and needs something stronger. Pharmacy [facility]

## 2018-07-25 NOTE — Telephone Encounter (Signed)
Pt is aware as instructed 

## 2019-01-02 ENCOUNTER — Other Ambulatory Visit: Payer: Self-pay | Admitting: Internal Medicine

## 2019-05-30 ENCOUNTER — Encounter: Payer: BLUE CROSS/BLUE SHIELD | Admitting: Internal Medicine

## 2019-08-23 ENCOUNTER — Encounter: Payer: BLUE CROSS/BLUE SHIELD | Admitting: Internal Medicine

## 2019-08-29 ENCOUNTER — Other Ambulatory Visit: Payer: Self-pay | Admitting: Internal Medicine

## 2019-09-11 ENCOUNTER — Other Ambulatory Visit: Payer: Self-pay | Admitting: Internal Medicine

## 2019-09-11 DIAGNOSIS — Z1231 Encounter for screening mammogram for malignant neoplasm of breast: Secondary | ICD-10-CM

## 2019-09-25 ENCOUNTER — Other Ambulatory Visit: Payer: Self-pay | Admitting: Internal Medicine

## 2019-10-29 ENCOUNTER — Other Ambulatory Visit: Payer: Self-pay | Admitting: Internal Medicine

## 2019-11-27 ENCOUNTER — Other Ambulatory Visit: Payer: Self-pay | Admitting: Internal Medicine

## 2019-12-31 ENCOUNTER — Other Ambulatory Visit: Payer: Self-pay | Admitting: Physician Assistant

## 2019-12-31 ENCOUNTER — Telehealth: Payer: Self-pay | Admitting: Physician Assistant

## 2019-12-31 ENCOUNTER — Ambulatory Visit (HOSPITAL_COMMUNITY)
Admission: RE | Admit: 2019-12-31 | Discharge: 2019-12-31 | Disposition: A | Discharge status: Home / Self Care | Payer: BC Managed Care – PPO | Source: Ambulatory Visit | Attending: Pulmonary Disease | Admitting: Pulmonary Disease

## 2019-12-31 DIAGNOSIS — U071 COVID-19: Secondary | ICD-10-CM | POA: Insufficient documentation

## 2019-12-31 DIAGNOSIS — E079 Disorder of thyroid, unspecified: Secondary | ICD-10-CM

## 2019-12-31 MED ORDER — EPINEPHRINE 0.3 MG/0.3ML IJ SOAJ: 0.3000 mg | Freq: Once | INTRAMUSCULAR | Status: DC | PRN

## 2019-12-31 MED ORDER — FAMOTIDINE IN NACL 20-0.9 MG/50ML-% IV SOLN: 20.0000 mg | Freq: Once | INTRAVENOUS | Status: DC | PRN

## 2019-12-31 MED ORDER — ALBUTEROL SULFATE HFA 108 (90 BASE) MCG/ACT IN AERS: 2.0000 | INHALATION_SPRAY | Freq: Once | RESPIRATORY_TRACT | Status: DC | PRN

## 2019-12-31 MED ORDER — SODIUM CHLORIDE 0.9 % IV SOLN: INTRAVENOUS | Status: DC | PRN

## 2019-12-31 MED ORDER — DIPHENHYDRAMINE HCL 50 MG/ML IJ SOLN: 50.0000 mg | Freq: Once | INTRAMUSCULAR | Status: DC | PRN

## 2019-12-31 MED ORDER — SODIUM CHLORIDE 0.9 % IV SOLN
1200.0000 mg | Freq: Once | INTRAVENOUS | Status: AC
  Administered 2019-12-31: 1200 mg via INTRAVENOUS
  Filled 2019-12-31: qty 10

## 2019-12-31 MED ORDER — METHYLPREDNISOLONE SODIUM SUCC 125 MG IJ SOLR: 125.0000 mg | Freq: Once | INTRAMUSCULAR | Status: DC | PRN

## 2019-12-31 NOTE — Progress Notes (Signed)
  Diagnosis: COVID-19  Physician:Dr Joya Gaskins  Procedure: [facility] Med: casirivimab\imdevimab infusion - Provided patient with casirivimab\imdevimab fact sheet for patients, parents and caregivers prior to infusion.  Complications: No immediate complications noted.  Discharge: Discharged home   [place], [place] 12/31/2019

## 2019-12-31 NOTE — Telephone Encounter (Addendum)
I connected by phone with Mary Munoz on 12/31/2019 at 11:29 AM to discuss the potential use of a new treatment for mild to moderate COVID-19 viral infection in non-hospitalized patients.  This patient is a 64 y.o. female that meets the FDA criteria for Emergency Use Authorization of COVID monoclonal antibody casirivimab/imdevimab.  Has a (+) direct SARS-CoV-2 viral test result  Has mild or moderate COVID-19   Is NOT hospitalized due to COVID-19  Is within 10 days of symptom onset  Has at least one of the high risk factor(s) for progression to severe COVID-19 and/or hospitalization as defined in EUA.  Specific high risk criteria : Older age (>/= 66 yo)   I have spoken and communicated the following to the patient or parent/caregiver regarding COVID monoclonal antibody treatment:  1. FDA has authorized the emergency use for the treatment of mild to moderate COVID-19 in adults and pediatric patients with positive results of direct SARS-CoV-2 viral testing who are 48 years of age and older weighing at least 40 kg, and who are at high risk for progressing to severe COVID-19 and/or hospitalization.  2. The significant known and potential risks and benefits of COVID monoclonal antibody, and the extent to which such potential risks and benefits are unknown.  3. Information on available alternative treatments and the risks and benefits of those alternatives, including clinical trials.  4. Patients treated with COVID monoclonal antibody should continue to self-isolate and use infection control measures (e.g., wear mask, isolate, social distance, avoid sharing personal items, clean and disinfect "high touch" surfaces, and frequent handwashing) according to CDC guidelines.   5. The patient or parent/caregiver has the option to accept or refuse COVID monoclonal antibody treatment.  After reviewing this information with the patient, The patient agreed to proceed with receiving  casirivimab\imdevimab infusion and will be provided a copy of the Fact sheet prior to receiving the infusion. Mary Munoz 12/31/2019 11:29 AM

## 2019-12-31 NOTE — Discharge Instructions (Signed)

## 2019-12-31 NOTE — Progress Notes (Signed)
  Diagnosis: COVID-19  Physician: Dr. Joya Gaskins  Procedure: [facility] Infusion Clinic Med: casirivimab\imdevimab infusion - Provided patient with casirivimab\imdevimab fact sheet for patients, parents and caregivers prior to infusion.  Complications: No immediate complications noted.  Discharge: Discharged home   Tia Masker 12/31/2019

## 2020-01-01 ENCOUNTER — Telehealth: Payer: Self-pay | Admitting: *Deleted

## 2020-01-01 MED ORDER — BENZONATATE 200 MG PO CAPS: 200.0000 mg | ORAL_CAPSULE | Freq: Three times a day (TID) | ORAL | 1 refills | Status: DC | PRN

## 2020-01-01 NOTE — Telephone Encounter (Signed)
Spoke to her Will send benzonatate and hydrocodone syrup for husband (for night) They can each use both of them

## 2020-01-01 NOTE — Telephone Encounter (Addendum)
Patient left a voicemail stating that she went to the hospital yesterday for an infusion because she has covid. Patient stated that she has a terrible cough and was told at the hospital yesterday that she can call her PCP and get him to prescribe a cough medication for her. Patient stated that her main symptom is the cough. Patient stated that she thinks that she is going to be okay after having the infusion. Patient stated that she does not think that she needs to have a virtual visit at this time. Pharmacy [facility]

## 2020-01-06 ENCOUNTER — Telehealth: Payer: Self-pay | Admitting: Internal Medicine

## 2020-01-06 NOTE — Telephone Encounter (Signed)
Open in error

## 2020-01-27 ENCOUNTER — Other Ambulatory Visit: Payer: Self-pay | Admitting: Internal Medicine

## 2020-02-26 ENCOUNTER — Other Ambulatory Visit: Payer: Self-pay | Admitting: Internal Medicine

## 2020-03-03 ENCOUNTER — Telehealth: Payer: Self-pay | Admitting: Internal Medicine

## 2020-03-03 NOTE — Telephone Encounter (Signed)
No, she has not been seen in over a year an a half. She needs at least an ov for her thyroid and can have labs done then.

## 2020-03-03 NOTE — Telephone Encounter (Signed)
Pt called wanting to schedule lab appointment only.  She stated this was to make sure her meds are working correctly.  She stated she would also need thyroid lab  Pt does have time to come in for appointment.  She is very busy at work  Works in [employer]

## 2020-03-04 NOTE — Telephone Encounter (Signed)
Patient scheduled appointment on 03/17/20.

## 2020-03-17 ENCOUNTER — Encounter: Payer: Self-pay | Admitting: Internal Medicine

## 2020-03-17 ENCOUNTER — Other Ambulatory Visit: Payer: Self-pay

## 2020-03-17 ENCOUNTER — Ambulatory Visit (INDEPENDENT_AMBULATORY_CARE_PROVIDER_SITE_OTHER): Payer: BC Managed Care – PPO | Admitting: Internal Medicine

## 2020-03-17 VITALS — BP 128/80 | HR 69 | Ht 62.25 in | Wt 192.0 lb

## 2020-03-17 DIAGNOSIS — I10 Essential (primary) hypertension: Secondary | ICD-10-CM

## 2020-03-17 DIAGNOSIS — Z1211 Encounter for screening for malignant neoplasm of colon: Secondary | ICD-10-CM | POA: Diagnosis not present

## 2020-03-17 DIAGNOSIS — Z23 Encounter for immunization: Secondary | ICD-10-CM

## 2020-03-17 DIAGNOSIS — Z0000 Encounter for general adult medical examination without abnormal findings: Secondary | ICD-10-CM

## 2020-03-17 DIAGNOSIS — E890 Postprocedural hypothyroidism: Secondary | ICD-10-CM

## 2020-03-17 LAB — CBC
HCT: 43.1 % (ref 36.0–46.0)
Hemoglobin: 14.3 g/dL (ref 12.0–15.0)
MCHC: 33.1 g/dL (ref 30.0–36.0)
MCV: 89.4 fl (ref 78.0–100.0)
Platelets: 287 10*3/uL (ref 150.0–400.0)
RBC: 4.82 Mil/uL (ref 3.87–5.11)
RDW: 15.2 % (ref 11.5–15.5)
WBC: 7.1 10*3/uL (ref 4.0–10.5)

## 2020-03-17 LAB — COMPREHENSIVE METABOLIC PANEL
ALT: 41 U/L — ABNORMAL HIGH (ref 0–35)
AST: 28 U/L (ref 0–37)
Albumin: 4.1 g/dL (ref 3.5–5.2)
Alkaline Phosphatase: 112 U/L (ref 39–117)
BUN: 13 mg/dL (ref 6–23)
CO2: 30 mEq/L (ref 19–32)
Calcium: 9.5 mg/dL (ref 8.4–10.5)
Chloride: 105 mEq/L (ref 96–112)
Creatinine, Ser: 0.86 mg/dL (ref 0.40–1.20)
GFR: 70.64 mL/min (ref >60.00)
Glucose, Bld: 89 mg/dL (ref 70–99)
Potassium: 4.7 mEq/L (ref 3.5–5.1)
Sodium: 140 mEq/L (ref 135–145)
Total Bilirubin: 0.5 mg/dL (ref 0.2–1.2)
Total Protein: 6.4 g/dL (ref 6.0–8.3)

## 2020-03-17 LAB — TSH: TSH: 2.64 u[IU]/mL (ref 0.35–4.50)

## 2020-03-17 LAB — T4, FREE: Free T4: 1.02 ng/dL (ref 0.60–1.60)

## 2020-03-17 NOTE — Assessment & Plan Note (Signed)
Postoperative Seems euthyroid Will check labs

## 2020-03-17 NOTE — Assessment & Plan Note (Signed)
BP Readings from Last 3 Encounters:  03/17/20 128/80  12/31/19 132/72  07/18/18 134/84   Well controlled on the olmesartan Will check labs

## 2020-03-17 NOTE — Addendum Note (Signed)
Addended by: Amado Coe on: 03/17/2020 09:12 AM   Modules accepted: Orders

## 2020-03-17 NOTE — Progress Notes (Signed)
Subjective:    Patient ID: Mary Munoz, female    DOB: 19-Jun-1955, 64 y.o.   MRN: [id number]  HPI Here for follow up of HTN and hypothyroidism This visit occurred during the SARS-CoV-2 public health emergency.  Safety protocols were in place, including screening questions prior to the visit, additional usage of staff PPE, and extensive cleaning of exam room while observing appropriate contact time as indicated for disinfecting solutions.   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 levothyroxine (SYNTHROID) 88 MCG tablet TAKE 1 TABLET BY MOUTH ONCE DAILY PER MD - MUST SCHEDULE OFFICE VISIT 30 tablet 0  . olmesartan (BENICAR) 20 MG tablet TAKE 1 TABLET BY MOUTH ONCE DAILY 30 tablet 0   No current facility-administered medications on file prior to visit.    Allergies  Allergen Reactions  . Citric Acid Other (See Comments)    Bladder infection  . Sulfa Antibiotics Other (See Comments)    Gets sores in mouth  . Sulfacetamide Sodium     Other reaction(s): Other (See Comments) Gets sores in mouth    Past Medical History:  Diagnosis Date  . 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 Cancer ([place])    thyroid    . Cardiac dysrhythmia, unspecified    pvc's Dr Marlou Porch evaluated 4/14- in [facility].    Marland Kitchen GERD (gastroesophageal reflux disease)   . History of shingles ? 2010   NO RESIDUAL PROBLEMS FROM SHINGLES  . Hypercholesterolemia   . Hypertension   . Hypothyroidism   . Obesity   . OSA on CPAP   . Vertigo   . Vestibular neuronitis     Past Surgical History:  Procedure Laterality Date  . BREAST BIOPSY  1974    BENIGN  . DILATION AND CURETTAGE OF UTERUS  09/21/11   D&C AND HYSTEROSCOPY FOR POSTMENOPAUSAL BLEEDING  . MICROLARYNGOSCOPY W/VOCAL CORD INJECTION Right 11/27/2015   Procedure: MICROLARYNGOSCOPY WITH RIGHT VOCAL CORD INJECTION MEDIALIZATION;  Surgeon: Jerrell Belfast, MD;  Location: [facility];  Service: ENT;  Laterality: Right;  . THYROIDECTOMY N/A 08/13/2014   Procedure: RIGHT THYROIDECTOMY;  Surgeon: Ralene Ok, MD;  Location: [facility];  Service: General;  Laterality: N/A;  . TOTAL HIP ARTHROPLASTY Left 08/08/2012   Procedure: TOTAL HIP ARTHROPLASTY ANTERIOR APPROACH;  Surgeon: Gearlean Alf, MD;  Location: [facility];  Service: Orthopedics;  Laterality: Left;  . TOTAL HIP ARTHROPLASTY Right 01/23/2013   Procedure: RIGHT TOTAL HIP ARTHROPLASTY ANTERIOR APPROACH;  Surgeon: Gearlean Alf, MD;  Location: [facility];  Service: Orthopedics;  Laterality: Right;  . TUBAL LIGATION Bilateral 1985    Family History  Problem Relation Age of Onset  . Dementia Mother   . Hypertension Mother   . Diabetes Mother   . Thyroid disease Mother   . CVA Mother   . Alzheimer's disease Mother   . Hypertension Father   . Heart attack Father   . Stroke Father   . Colon cancer Sister   . Thyroid disease Sister   . Heart disease Sister        CABG  . Thyroid disease Sister   .  Cancer Neg Hx     Social History   Socioeconomic History  . Marital status: Married    Spouse name: Not on file  . Number of children: 1  . Years of education: college  . Highest education level: Not on file  Occupational History  . Occupation: Bookkeeper/Payroll    Employer: [employer]  Tobacco Use  . Smoking status: Never Smoker  . Smokeless tobacco: Never Used  Substance and Sexual Activity  . Alcohol use: No  . Drug use: No  . Sexual activity: Yes    Partners: Male    Birth control/protection: Post-menopausal, Surgical    Comment: BTL  Other Topics Concern  . Not on file  Social History Narrative   Married   Right  handed   Caffeine use- 1 cup coffee daily, occasional tea      No living will   Husband to be Media planner   Social Determinants of Health   Financial Resource Strain:   . Difficulty of Paying Living Expenses: Not on file  Food Insecurity:   . Worried About Charity fundraiser in the Last Year: Not on file  . Ran Out of Food in the Last Year: Not on file  Transportation Needs:   . Lack of Transportation (Medical): Not on file  . Lack of Transportation (Non-Medical): Not on file  Physical Activity:   . Days of Exercise per Week: Not on file  . Minutes of Exercise per Session: Not on file  Stress:   . Feeling of Stress : Not on file  Social Connections:   . Frequency of Communication with Friends and Family: Not on file  . Frequency of Social Gatherings with Friends and Family: Not on file  . Attends Religious Services: Not on file  . Active Member of Clubs or Organizations: Not on file  . Attends Archivist Meetings: Not on file  . Marital Status: Not on file  Intimate Partner Violence:   . Fear of Current or Ex-Partner: Not on file  . Emotionally Abused: Not on file  . Physically Abused: Not on file  . Sexually Abused: Not on file   Review of Systems Appetite is good Weight stable Sleeps well Plans to work another 4 years    Objective:   Physical Exam Constitutional:      Appearance: Normal appearance.  Cardiovascular:     Rate and Rhythm: Normal rate and regular rhythm.     Pulses: Normal pulses.     Heart sounds: No murmur heard.  No gallop.   Pulmonary:     Effort: Pulmonary effort is normal.     Breath sounds: Normal breath sounds. No wheezing or rales.  Abdominal:     Palpations: Abdomen is soft.     Tenderness: There is no abdominal tenderness.  Musculoskeletal:     Cervical back: Neck supple.     Right lower leg: No edema.     Left lower leg: No edema.  Lymphadenopathy:     Cervical: No cervical adenopathy.  Neurological:     Mental  Status: She is alert.  Psychiatric:        Mood and Affect: Mood normal.        Behavior: Behavior normal.            Assessment & Plan:

## 2020-03-17 NOTE — Patient Instructions (Signed)
Please set up your screening mammogram. 

## 2020-03-17 NOTE — Assessment & Plan Note (Signed)
Will give flu vaccine and shingrix today COVID vaccine later this month She will set up mammogram Due for colon  One last pap next year

## 2020-03-26 ENCOUNTER — Other Ambulatory Visit: Payer: Self-pay | Admitting: Internal Medicine

## 2020-05-19 ENCOUNTER — Ambulatory Visit: Payer: BC Managed Care – PPO

## 2020-05-20 ENCOUNTER — Ambulatory Visit (INDEPENDENT_AMBULATORY_CARE_PROVIDER_SITE_OTHER): Payer: BC Managed Care – PPO

## 2020-05-20 ENCOUNTER — Other Ambulatory Visit: Payer: Self-pay

## 2020-05-20 DIAGNOSIS — Z23 Encounter for immunization: Secondary | ICD-10-CM

## 2020-07-31 ENCOUNTER — Encounter: Payer: BC Managed Care – PPO | Admitting: Internal Medicine

## 2020-10-30 ENCOUNTER — Telehealth: Payer: Self-pay

## 2020-10-30 NOTE — Telephone Encounter (Signed)
I spoke with pt; starting on 10/28/20 pt began with dry cough, pt has sinus congestion and wheezing. No SOB or fever.No other covid symptoms. No available appts at [facility] and pt is very congested in chest and will go to [facility] in [place] for eval and possible covid testing and cxr. Sending FYI to Dr Silvio Pate.

## 2020-10-30 NOTE — Telephone Encounter (Signed)
[facility] Night - Client Nonclinical Telephone Record AccessNurse Client [name] Night - Client Client Site [place] - Night Contact Type Call Who Is Calling Patient / Member / Family / Caregiver Caller Name Mary Munoz Caller Phone Number [phone] Patient Name Mary Munoz Patient DOB 05-Sep-1955 Call Type Message Only Information Provided Reason for Call Request to Schedule Office Appointment Initial Comment Caller states that she is coughing and she is stopped up. Declined triage. Patient request to speak to RN No Disp. Time Disposition Final User 10/30/2020 7:42:53 AM General Information Provided Yes Windy Canny Call Closed By: Windy Canny Transaction Date/Time: 10/30/2020 7:40:58 AM (ET)

## 2020-10-30 NOTE — Telephone Encounter (Signed)
Okay Please check on her on Monday to see how she is doing

## 2020-11-02 NOTE — Telephone Encounter (Signed)
Spoke to pt. She went to [facility] and tested negative for Covid and Flu. Taking a cough med and Advil Cold and Sinus. Feeling a little better. Knows it has to run its course and will take time.

## 2021-01-18 ENCOUNTER — Other Ambulatory Visit: Payer: Self-pay | Admitting: Internal Medicine

## 2021-03-19 ENCOUNTER — Ambulatory Visit (INDEPENDENT_AMBULATORY_CARE_PROVIDER_SITE_OTHER): Payer: BC Managed Care – PPO | Admitting: Internal Medicine

## 2021-03-19 ENCOUNTER — Encounter: Payer: Self-pay | Admitting: Internal Medicine

## 2021-03-19 ENCOUNTER — Other Ambulatory Visit (HOSPITAL_COMMUNITY)
Admission: RE | Admit: 2021-03-19 | Discharge: 2021-03-19 | Disposition: A | Discharge status: Home / Self Care | Payer: BC Managed Care – PPO | Source: Ambulatory Visit | Attending: Internal Medicine | Admitting: Internal Medicine

## 2021-03-19 ENCOUNTER — Other Ambulatory Visit: Payer: Self-pay

## 2021-03-19 VITALS — BP 126/80 | HR 87 | Temp 97.5°F | Ht 62.0 in | Wt 200.0 lb

## 2021-03-19 DIAGNOSIS — Z23 Encounter for immunization: Secondary | ICD-10-CM

## 2021-03-19 DIAGNOSIS — E890 Postprocedural hypothyroidism: Secondary | ICD-10-CM | POA: Diagnosis not present

## 2021-03-19 DIAGNOSIS — I10 Essential (primary) hypertension: Secondary | ICD-10-CM | POA: Diagnosis not present

## 2021-03-19 DIAGNOSIS — Z0000 Encounter for general adult medical examination without abnormal findings: Secondary | ICD-10-CM

## 2021-03-19 LAB — CBC
HCT: 41.8 % (ref 36.0–46.0)
Hemoglobin: 13.5 g/dL (ref 12.0–15.0)
MCHC: 32.3 g/dL (ref 30.0–36.0)
MCV: 89.1 fl (ref 78.0–100.0)
Platelets: 274 10*3/uL (ref 150.0–400.0)
RBC: 4.7 Mil/uL (ref 3.87–5.11)
RDW: 14.6 % (ref 11.5–15.5)
WBC: 7.2 10*3/uL (ref 4.0–10.5)

## 2021-03-19 LAB — COMPREHENSIVE METABOLIC PANEL
ALT: 30 U/L (ref 0–35)
AST: 23 U/L (ref 0–37)
Albumin: 4.1 g/dL (ref 3.5–5.2)
Alkaline Phosphatase: 110 U/L (ref 39–117)
BUN: 14 mg/dL (ref 6–23)
CO2: 28 mEq/L (ref 19–32)
Calcium: 9.2 mg/dL (ref 8.4–10.5)
Chloride: 108 mEq/L (ref 96–112)
Creatinine, Ser: 0.88 mg/dL (ref 0.40–1.20)
GFR: 68.23 mL/min (ref >60.00)
Glucose, Bld: 97 mg/dL (ref 70–99)
Potassium: 4.6 mEq/L (ref 3.5–5.1)
Sodium: 142 mEq/L (ref 135–145)
Total Bilirubin: 0.6 mg/dL (ref 0.2–1.2)
Total Protein: 6.5 g/dL (ref 6.0–8.3)

## 2021-03-19 LAB — TSH: TSH: 1.14 u[IU]/mL (ref 0.35–5.50)

## 2021-03-19 LAB — T4, FREE: Free T4: 1.26 ng/dL (ref 0.60–1.60)

## 2021-03-19 NOTE — Addendum Note (Signed)
Addended by: Pilar Grammes on: 03/19/2021 09:56 AM   Modules accepted: Orders

## 2021-03-19 NOTE — Patient Instructions (Addendum)
Please call [facility] and get your colonoscopy set up. Please set up your screening mammogram.  DASH Eating Plan DASH stands for Dietary Approaches to Stop Hypertension. The DASH eating plan is a healthy eating plan that has been shown to: Reduce high blood pressure (hypertension). Reduce your risk for type 2 diabetes, heart disease, and stroke. Help with weight loss. What are tips for following this plan? Reading food labels Check food labels for the amount of salt (sodium) per serving. Choose foods with less than 5 percent of the Daily Value of sodium. Generally, foods with less than 300 milligrams (mg) of sodium per serving fit into this eating plan. To find whole grains, look for the word "whole" as the first word in the ingredient list. Shopping Buy products labeled as "low-sodium" or "no salt added." Buy fresh foods. Avoid canned foods and pre-made or frozen meals. Cooking Avoid adding salt when cooking. Use salt-free seasonings or herbs instead of table salt or sea salt. Check with your health care provider or pharmacist before using salt substitutes. Do not fry foods. Cook foods using healthy methods such as baking, boiling, grilling, roasting, and broiling instead. Cook with heart-healthy oils, such as olive, canola, avocado, soybean, or sunflower oil. Meal planning  Eat a balanced diet that includes: 4 or more servings of fruits and 4 or more servings of vegetables each day. Try to fill one-half of your plate with fruits and vegetables. 6-8 servings of whole grains each day. Less than 6 oz (170 g) of lean meat, poultry, or fish each day. A 3-oz (85-g) serving of meat is about the same size as a deck of cards. One egg equals 1 oz (28 g). 2-3 servings of low-fat dairy each day. One serving is 1 cup (237 mL). 1 serving of nuts, seeds, or beans 5 times each week. 2-3 servings of heart-healthy fats. Healthy fats called omega-3 fatty acids are found in foods such as walnuts, flaxseeds,  fortified milks, and eggs. These fats are also found in cold-water fish, such as sardines, salmon, and mackerel. Limit how much you eat of: Canned or prepackaged foods. Food that is high in trans fat, such as some fried foods. Food that is high in saturated fat, such as fatty meat. Desserts and other sweets, sugary drinks, and other foods with added sugar. Full-fat dairy products. Do not salt foods before eating. Do not eat more than 4 egg yolks a week. Try to eat at least 2 vegetarian meals a week. Eat more home-cooked food and less restaurant, buffet, and fast food. Lifestyle When eating at a restaurant, ask that your food be prepared with less salt or no salt, if possible. If you drink alcohol: Limit how much you use to: 0-1 drink a day for women who are not pregnant. 0-2 drinks a day for men. Be aware of how much alcohol is in your drink. In the [place] one drink equals one 12 oz bottle of beer (355 mL), one 5 oz glass of wine (148 mL), or one 1 oz glass of hard liquor (44 mL). General information Avoid eating more than 2,300 mg of salt a day. If you have hypertension, you may need to reduce your sodium intake to 1,500 mg a day. Work with your health care provider to maintain a healthy body weight or to lose weight. Ask what an ideal weight is for you. Get at least 30 minutes of exercise that causes your heart to beat faster (aerobic exercise) most days of the week.  Activities may include walking, swimming, or biking. Work with your health care provider or dietitian to adjust your eating plan to your individual calorie needs. What foods should I eat? Fruits All fresh, dried, or frozen fruit. Canned fruit in natural juice (without added sugar). Vegetables Fresh or frozen vegetables (raw, steamed, roasted, or grilled). Low-sodium or reduced-sodium tomato and vegetable juice. Low-sodium or reduced-sodium tomato sauce and tomato paste. Low-sodium or reduced-sodium canned  vegetables. Grains Whole-grain or whole-wheat bread. Whole-grain or whole-wheat pasta. Brown rice. [place]. Bulgur. Whole-grain and low-sodium cereals. Pita bread. Low-fat, low-sodium crackers. Whole-wheat flour tortillas. Meats and other proteins Skinless chicken or [place]. Ground chicken or [place]. Pork with fat trimmed off. Fish and seafood. Egg whites. Dried beans, peas, or lentils. Unsalted nuts, nut butters, and seeds. Unsalted canned beans. Lean cuts of beef with fat trimmed off. Low-sodium, lean precooked or cured meat, such as sausages or meat loaves. Dairy Low-fat (1%) or fat-free (skim) milk. Reduced-fat, low-fat, or fat-free cheeses. Nonfat, low-sodium ricotta or cottage cheese. Low-fat or nonfat yogurt. Low-fat, low-sodium cheese. Fats and oils Soft margarine without trans fats. Vegetable oil. Reduced-fat, low-fat, or light mayonnaise and salad dressings (reduced-sodium). Canola, safflower, olive, avocado, soybean, and sunflower oils. Avocado. Seasonings and condiments Herbs. Spices. Seasoning mixes without salt. Other foods Unsalted popcorn and pretzels. Fat-free sweets. The items listed above may not be a complete list of foods and beverages you can eat. Contact a dietitian for more information. What foods should I avoid? Fruits Canned fruit in a light or heavy syrup. Fried fruit. Fruit in cream or butter sauce. Vegetables Creamed or fried vegetables. Vegetables in a cheese sauce. Regular canned vegetables (not low-sodium or reduced-sodium). Regular canned tomato sauce and paste (not low-sodium or reduced-sodium). Regular tomato and vegetable juice (not low-sodium or reduced-sodium). Angie Fava. Olives. Grains Baked goods made with fat, such as croissants, muffins, or some breads. Dry pasta or rice meal packs. Meats and other proteins Fatty cuts of meat. Ribs. Fried meat. Berniece Salines. Bologna, salami, and other precooked or cured meats, such as sausages or meat loaves. Fat from  the back of a pig (fatback). Bratwurst. Salted nuts and seeds. Canned beans with added salt. Canned or smoked fish. Whole eggs or egg yolks. Chicken or [place] with skin. Dairy Whole or 2% milk, cream, and half-and-half. Whole or full-fat cream cheese. Whole-fat or sweetened yogurt. Full-fat cheese. Nondairy creamers. Whipped toppings. Processed cheese and cheese spreads. Fats and oils Butter. Stick margarine. Lard. Shortening. Ghee. Bacon fat. Tropical oils, such as coconut, palm kernel, or palm oil. Seasonings and condiments Onion salt, garlic salt, seasoned salt, table salt, and sea salt. Worcestershire sauce. Tartar sauce. Barbecue sauce. Teriyaki sauce. Soy sauce, including reduced-sodium. Steak sauce. Canned and packaged gravies. Fish sauce. Oyster sauce. Cocktail sauce. Store-bought horseradish. Ketchup. Mustard. Meat flavorings and tenderizers. Bouillon cubes. Hot sauces. Pre-made or packaged marinades. Pre-made or packaged taco seasonings. Relishes. Regular salad dressings. Other foods Salted popcorn and pretzels. The items listed above may not be a complete list of foods and beverages you should avoid. Contact a dietitian for more information. Where to find more information National Heart, Lung, and Blood Institute: https://wilson-eaton.com/ American Heart Association: www.heart.org Academy of Nutrition and Dietetics: www.eatright.[place]: www.kidney.org Summary The DASH eating plan is a healthy eating plan that has been shown to reduce high blood pressure (hypertension). It may also reduce your risk for type 2 diabetes, heart disease, and stroke. When on the DASH eating plan, aim to eat more  fresh fruits and vegetables, whole grains, lean proteins, low-fat dairy, and heart-healthy fats. With the DASH eating plan, you should limit salt (sodium) intake to 2,300 mg a day. If you have hypertension, you may need to reduce your sodium intake to 1,500 mg a day. Work with your  health care provider or dietitian to adjust your eating plan to your individual calorie needs. This information is not intended to replace advice given to you by your health care provider. Make sure you discuss any questions you have with your health care provider. Document Revised: 03/29/2019 Document Reviewed: 03/29/2019 Elsevier Patient Education  2022 [employer].

## 2021-03-19 NOTE — Assessment & Plan Note (Signed)
Seems euthyroid ?Will check labs ?

## 2021-03-19 NOTE — Progress Notes (Signed)
Subjective:    Patient ID: Mary Munoz, female    DOB: 1956/04/14, 65 y.o.   MRN: [id number]  HPI Here for physical This visit occurred during the SARS-CoV-2 public health emergency.  Safety protocols were in place, including screening questions prior to the visit, additional usage of staff PPE, and extensive cleaning of exam room while observing appropriate contact time as indicated for disinfecting solutions.   Has gained more weight Plans food diary---to find out why she went up Does drink sugared drinks though No exercise Still commutes to [place] for work  Current Outpatient Medications on File Prior to Visit  Medication Sig Dispense Refill   levothyroxine (SYNTHROID) 88 MCG tablet Take 1 tablet (88 mcg total) by mouth daily before breakfast. 90 tablet 3   olmesartan (BENICAR) 20 MG tablet TAKE 1 TABLET BY MOUTH ONCE DAILY 90 tablet 3   No current facility-administered medications on file prior to visit.    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     GERD (gastroesophageal reflux disease)    History of shingles ? 2010   NO RESIDUAL PROBLEMS FROM SHINGLES   Hypercholesterolemia    Hypertension    Hypothyroidism    Obesity    OSA on CPAP    Vertigo    Vestibular neuronitis     Past Surgical History:  Procedure Laterality Date   BREAST BIOPSY  1974   BENIGN   DILATION AND CURETTAGE OF UTERUS  09/21/11   D&C AND HYSTEROSCOPY FOR POSTMENOPAUSAL BLEEDING   MICROLARYNGOSCOPY W/VOCAL CORD INJECTION Right 11/27/2015   Procedure: MICROLARYNGOSCOPY WITH  RIGHT VOCAL CORD INJECTION MEDIALIZATION;  Surgeon: Jerrell Belfast, MD;  Location: [facility];  Service: ENT;  Laterality: Right;   THYROIDECTOMY N/A 08/13/2014   Procedure: RIGHT THYROIDECTOMY;  Surgeon: Ralene Ok, MD;  Location: [facility];  Service: General;  Laterality: N/A;   TOTAL HIP ARTHROPLASTY Left 08/08/2012   Procedure: TOTAL HIP ARTHROPLASTY ANTERIOR APPROACH;  Surgeon: Gearlean Alf, MD;  Location: [facility];  Service: Orthopedics;  Laterality: Left;   TOTAL HIP ARTHROPLASTY Right 01/23/2013   Procedure: RIGHT TOTAL HIP ARTHROPLASTY ANTERIOR APPROACH;  Surgeon: Gearlean Alf, MD;  Location: [facility];  Service: Orthopedics;  Laterality: Right;   TUBAL LIGATION Bilateral 1985    Family History  Problem Relation Age of Onset   Dementia Mother    Hypertension Mother    Diabetes Mother    Thyroid disease Mother    CVA Mother    Alzheimer's disease Mother    Hypertension Father    Heart attack Father    Stroke Father    Colon cancer Sister    Thyroid disease Sister    Heart disease Sister        CABG   Thyroid disease Sister    Dementia Sister    Cancer Neg Hx     Social History   Socioeconomic History   Marital status: Married    Spouse name: Not on file  Number of children: 1   Years of education: college   Highest education level: Not on file  Occupational History   Occupation: Bookkeeper/Payroll    Employer: [employer]  Tobacco Use   Smoking status: Never   Smokeless tobacco: Never  Substance and Sexual Activity   Alcohol use: No   Drug use: No   Sexual activity: Yes    Partners: Male    Birth control/protection: Post-menopausal, Surgical    Comment: BTL  Other Topics Concern   Not on file  Social History Narrative   Married   Right handed   Caffeine use- 1 cup coffee daily, occasional tea      No living will   Husband to be Media planner   Social Determinants of Health   Financial Resource Strain: Not on file  Food Insecurity: Not on file   Transportation Needs: Not on file  Physical Activity: Not on file  Stress: Not on file  Social Connections: Not on file  Intimate Partner Violence: Not on file   Review of Systems  Constitutional:  Positive for unexpected weight change. Negative for fatigue.       Wears seat belt  HENT:  Negative for dental problem, hearing loss and trouble swallowing.        Keeps up with dentist  Eyes:  Negative for visual disturbance.       No diplopia or unilateral vision loss  Respiratory:  Negative for chest tightness.        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  Gastrointestinal:  Negative for blood in stool and constipation.       No heartburn  Endocrine: Negative for polydipsia and polyuria.  Genitourinary:  Negative for dysuria and hematuria.  Musculoskeletal:  Negative for arthralgias, back pain and joint swelling.  Skin:  Negative for rash.  Allergic/Immunologic: Negative for environmental allergies and immunocompromised state.  Neurological:  Negative for dizziness, syncope, light-headedness and headaches.  Hematological:  Negative for adenopathy. Does not bruise/bleed easily.  Psychiatric/Behavioral:  Negative for dysphoric mood. The patient is not nervous/anxious.        Sleeps well with CPAP nightly      Objective:   Physical Exam Constitutional:      Appearance: Normal appearance.  HENT:     Right Ear: Tympanic membrane and ear canal normal.     Left Ear: Tympanic membrane and ear canal normal.     Mouth/Throat:     Pharynx: No oropharyngeal exudate or posterior oropharyngeal erythema.  Eyes:     Conjunctiva/sclera: Conjunctivae normal.     Pupils: Pupils are equal, round, and reactive to light.  Cardiovascular:     Rate and Rhythm: Normal rate and regular rhythm.     Pulses: Normal pulses.     Heart sounds: No murmur heard.   No gallop.  Pulmonary:     Effort:  Pulmonary effort is normal.     Breath sounds: Normal breath sounds. No wheezing or rales.  Abdominal:     Palpations: Abdomen is soft.     Tenderness: There is no abdominal tenderness.  Genitourinary:    Comments: Normal introitus Cervix normal---pap done Musculoskeletal:     Cervical back: Neck supple.     Right lower leg: No edema.     Left lower leg: No edema.  Lymphadenopathy:     Cervical: No cervical adenopathy.  Skin:  Findings: No lesion or rash.  Neurological:     General: No focal deficit present.     Mental Status: She is alert and oriented to person, place, and time.  Psychiatric:        Mood and Affect: Mood normal.        Behavior: Behavior normal.           Assessment & Plan:

## 2021-03-19 NOTE — Addendum Note (Signed)
Addended by: Pilar Grammes on: 03/19/2021 10:27 AM   Modules accepted: Orders

## 2021-03-19 NOTE — Assessment & Plan Note (Signed)
BP Readings from Last 3 Encounters:  03/19/21 126/80  03/17/20 128/80  12/31/19 132/72   Good control on olmesartan

## 2021-03-19 NOTE — Assessment & Plan Note (Signed)
Healthy but out of shape---discussed exercise/DASH Overdue for colon and mammogram---numbers given for her to schedule [facility]) Flu vaccine and Td today Pap today

## 2021-03-23 LAB — CYTOLOGY - PAP
Adequacy: ABSENT
Comment: NEGATIVE
Diagnosis: NEGATIVE
High risk HPV: NEGATIVE

## 2021-04-22 ENCOUNTER — Other Ambulatory Visit: Payer: Self-pay | Admitting: Internal Medicine

## 2021-04-29 ENCOUNTER — Other Ambulatory Visit: Payer: Self-pay | Admitting: Internal Medicine

## 2021-04-29 ENCOUNTER — Ambulatory Visit
Admission: RE | Admit: 2021-04-29 | Discharge: 2021-04-29 | Disposition: A | Discharge status: Home / Self Care | Payer: BC Managed Care – PPO | Source: Ambulatory Visit | Attending: Internal Medicine | Admitting: Internal Medicine

## 2021-04-29 ENCOUNTER — Other Ambulatory Visit: Payer: Self-pay

## 2021-04-29 DIAGNOSIS — Z1231 Encounter for screening mammogram for malignant neoplasm of breast: Secondary | ICD-10-CM

## 2021-05-04 ENCOUNTER — Other Ambulatory Visit: Payer: Self-pay | Admitting: Internal Medicine

## 2021-05-04 DIAGNOSIS — R928 Other abnormal and inconclusive findings on diagnostic imaging of breast: Secondary | ICD-10-CM

## 2021-06-10 ENCOUNTER — Ambulatory Visit: Admission: RE | Admit: 2021-06-10 | Payer: Medicare Other | Source: Ambulatory Visit

## 2021-06-10 ENCOUNTER — Ambulatory Visit
Admission: RE | Admit: 2021-06-10 | Discharge: 2021-06-10 | Disposition: A | Discharge status: Home / Self Care | Payer: Medicare Other | Source: Ambulatory Visit | Attending: Internal Medicine | Admitting: Internal Medicine

## 2021-06-10 DIAGNOSIS — R928 Other abnormal and inconclusive findings on diagnostic imaging of breast: Secondary | ICD-10-CM

## 2022-04-28 ENCOUNTER — Other Ambulatory Visit: Payer: Self-pay | Admitting: Internal Medicine

## 2022-05-05 ENCOUNTER — Other Ambulatory Visit: Payer: Self-pay | Admitting: Internal Medicine

## 2022-05-10 NOTE — Telephone Encounter (Signed)
Pt needs CPE in the next 3 months. Thanks

## 2022-08-03 ENCOUNTER — Other Ambulatory Visit: Payer: Self-pay | Admitting: Internal Medicine

## 2022-08-03 NOTE — Telephone Encounter (Signed)
Levothyroxine filled for 90 days. Pt is past due for CPE. Please help her get scheduled. Thank you.

## 2022-08-03 NOTE — Telephone Encounter (Signed)
Called patient and scheduled for 4/11.

## 2022-08-12 ENCOUNTER — Other Ambulatory Visit: Payer: Self-pay | Admitting: Internal Medicine

## 2022-08-18 ENCOUNTER — Ambulatory Visit: Payer: Medicare Other | Admitting: Internal Medicine

## 2022-08-18 ENCOUNTER — Other Ambulatory Visit: Payer: Self-pay | Admitting: Internal Medicine

## 2022-08-18 ENCOUNTER — Encounter: Payer: Self-pay | Admitting: Internal Medicine

## 2022-08-18 VITALS — BP 118/82 | HR 66 | Temp 97.2°F | Ht 62.0 in | Wt 170.0 lb

## 2022-08-18 DIAGNOSIS — I10 Essential (primary) hypertension: Secondary | ICD-10-CM

## 2022-08-18 DIAGNOSIS — E890 Postprocedural hypothyroidism: Secondary | ICD-10-CM | POA: Diagnosis not present

## 2022-08-18 DIAGNOSIS — K219 Gastro-esophageal reflux disease without esophagitis: Secondary | ICD-10-CM | POA: Diagnosis not present

## 2022-08-18 DIAGNOSIS — G4733 Obstructive sleep apnea (adult) (pediatric): Secondary | ICD-10-CM

## 2022-08-18 DIAGNOSIS — Z0000 Encounter for general adult medical examination without abnormal findings: Secondary | ICD-10-CM

## 2022-08-18 DIAGNOSIS — E039 Hypothyroidism, unspecified: Secondary | ICD-10-CM

## 2022-08-18 DIAGNOSIS — J3801 Paralysis of vocal cords and larynx, unilateral: Secondary | ICD-10-CM

## 2022-08-18 LAB — COMPREHENSIVE METABOLIC PANEL
ALT: 13 U/L (ref 0–35)
AST: 16 U/L (ref 0–37)
Albumin: 4.2 g/dL (ref 3.5–5.2)
Alkaline Phosphatase: 126 U/L — ABNORMAL HIGH (ref 39–117)
BUN: 12 mg/dL (ref 6–23)
CO2: 28 mEq/L (ref 19–32)
Calcium: 9.7 mg/dL (ref 8.4–10.5)
Chloride: 106 mEq/L (ref 96–112)
Creatinine, Ser: 0.87 mg/dL (ref 0.40–1.20)
GFR: 68.49 mL/min (ref >60.00)
Glucose, Bld: 80 mg/dL (ref 70–99)
Potassium: 4.7 mEq/L (ref 3.5–5.1)
Sodium: 142 mEq/L (ref 135–145)
Total Bilirubin: 0.5 mg/dL (ref 0.2–1.2)
Total Protein: 6.5 g/dL (ref 6.0–8.3)

## 2022-08-18 LAB — LIPID PANEL
Cholesterol: 182 mg/dL (ref 0–200)
HDL: 41.9 mg/dL (ref >39.00)
LDL Cholesterol: 120 mg/dL — ABNORMAL HIGH (ref 0–99)
NonHDL: 140.31
Total CHOL/HDL Ratio: 4
Triglycerides: 102 mg/dL (ref 0.0–149.0)
VLDL: 20.4 mg/dL (ref 0.0–40.0)

## 2022-08-18 LAB — CBC
HCT: 40.5 % (ref 36.0–46.0)
Hemoglobin: 13.3 g/dL (ref 12.0–15.0)
MCHC: 32.7 g/dL (ref 30.0–36.0)
MCV: 85.5 fl (ref 78.0–100.0)
Platelets: 288 10*3/uL (ref 150.0–400.0)
RBC: 4.74 Mil/uL (ref 3.87–5.11)
RDW: 15.2 % (ref 11.5–15.5)
WBC: 6.9 10*3/uL (ref 4.0–10.5)

## 2022-08-18 LAB — TSH: TSH: 0.16 u[IU]/mL — ABNORMAL LOW (ref 0.35–5.50)

## 2022-08-18 LAB — T4, FREE: Free T4: 1.55 ng/dL (ref 0.60–1.60)

## 2022-08-18 NOTE — Progress Notes (Signed)
Subjective:    Patient ID: Mary Munoz, female    DOB: 1955/12/18, 67 y.o.   MRN: [id number]  HPI Here for Medicare wellness visit and follow up of chronic health conditions Reviewed advanced directives Reviewed other doctors--Dr Schooler--GI, [facility], Dr Emmie Niemann, [facility] dermatology No hospitalizations or surgery this year Vision is fine Hearing is good No alcohol or tobacco Not exercising still No falls No depression or anhedonia Independent with instrumental ADLs Still works full time No memory problems  Has aging CPAP machine May need to replace soon  Still gets some trouble swallowing---since thyroid surgery Careful with swallowing in general--small bites, etc No heartburn   Occasionally checks BP 117/75 or so No chest pain or SOB No dizziness or syncope No edema No headaches No palpitations  Has been careful with eating--still eats out a lot, but splitting meals Has lost 30# since last year! Still not exercising--just walks at work and keeps up with great grandchildren  Current Outpatient Medications on File Prior to Visit  Medication Sig Dispense Refill   levothyroxine (SYNTHROID) 88 MCG tablet TAKE ONE TAB BY MOUTH ONCE DAILY. TAKE ON AN EMPTY STOMACH WITH A GLASS OF WATER ATLEAST 30-60 MINUTES BEFORE BREAKFAST 90 tablet 0   olmesartan (BENICAR) 20 MG tablet TAKE ONE TABLET BY MOUTH ONCE DAILY. NEED PHYSICAL EXAM 90 tablet 0   No current facility-administered medications on file prior to visit.    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     GERD (gastroesophageal reflux disease)    History of shingles ? 2010   NO RESIDUAL PROBLEMS FROM SHINGLES   Hypercholesterolemia    Hypertension    Hypothyroidism    Obesity    OSA on CPAP    Vertigo    Vestibular neuronitis     Past Surgical History:  Procedure Laterality Date   BREAST BIOPSY  1974   BENIGN   DILATION AND CURETTAGE OF UTERUS  09/21/11   D&C AND HYSTEROSCOPY FOR POSTMENOPAUSAL BLEEDING   MICROLARYNGOSCOPY W/VOCAL CORD INJECTION Right 11/27/2015   Procedure: MICROLARYNGOSCOPY WITH RIGHT VOCAL CORD INJECTION MEDIALIZATION;  Surgeon: Osborn Coho, MD;  [facility];  Service: ENT;  Laterality: Right;   THYROIDECTOMY N/A 08/13/2014   Procedure: RIGHT THYROIDECTOMY;  Surgeon: Axel Filler, MD;  Location: [facility];  Service: General;  Laterality: N/A;   TOTAL HIP ARTHROPLASTY Left 08/08/2012   Procedure: TOTAL HIP ARTHROPLASTY ANTERIOR APPROACH;  Surgeon: Loanne Drilling, MD;  Location: [facility];  Service: Orthopedics;  Laterality: Left;   TOTAL HIP ARTHROPLASTY Right 01/23/2013   Procedure: RIGHT TOTAL HIP ARTHROPLASTY ANTERIOR APPROACH;  Surgeon: Loanne Drilling, MD;  Location: [facility];  Service: Orthopedics;  Laterality: Right;   TUBAL LIGATION Bilateral 1985    Family History  Problem Relation Age of Onset   Dementia Mother    Hypertension Mother    Diabetes Mother    Thyroid disease Mother    CVA Mother    Alzheimer's disease Mother  Hypertension Father    Heart attack Father    Stroke Father    Colon cancer Sister    Thyroid disease Sister    Heart disease Sister        CABG   Heart attack Sister 24   Thyroid disease Sister    Dementia Sister    Cancer Neg Hx     Social History   Socioeconomic History   Marital status: Married    Spouse name: Not on file   Number of children: 1   Years of education: college   Highest education level: Not on file  Occupational History   Occupation: Bookkeeper/Payroll    Employer: [employer]  Tobacco Use   Smoking status: Never    Passive exposure: Past   Smokeless tobacco: Never  Substance and Sexual Activity   Alcohol use: No   Drug use: No   Sexual activity: Yes    Partners: Male    Birth control/protection: Post-menopausal, Surgical    Comment: BTL  Other Topics Concern   Not on file  Social History Narrative   Married   Right handed   Caffeine use- 1 cup coffee daily, occasional tea      No living will   Husband to be decision maker--alternate is son   Would accept resuscitation but no prolonged machines or tube feeds if cognitively unaware   Social Determinants of Health   Financial Resource Strain: Not on file  Food Insecurity: Not on file  Transportation Needs: Not on file  Physical Activity: Not on file  Stress: Not on file  Social Connections: Not on file  Intimate Partner Violence: Not on file   Review of Systems Appetite is fine Sleeps okay Wears seat belt Teeth are fine--keeps up with dentist No suspicious skin lesions Bowels moving fine--no blood No urinary problems--no incontinence No sig back or joint pains    Objective:   Physical Exam Constitutional:      Appearance: Normal appearance.  HENT:     Mouth/Throat:     Pharynx: No oropharyngeal exudate or posterior oropharyngeal erythema.  Eyes:     Conjunctiva/sclera: Conjunctivae normal.     Pupils: Pupils are equal, round, and reactive to light.  Cardiovascular:     Rate and Rhythm: Normal rate and regular rhythm.     Pulses: Normal pulses.     Heart sounds: No murmur heard.    No gallop.  Pulmonary:     Effort: Pulmonary effort is normal.     Breath sounds: Normal breath sounds. No wheezing or rales.  Abdominal:     Palpations: Abdomen is soft.     Tenderness: There is no abdominal tenderness.  Musculoskeletal:     Cervical back: Neck supple.     Right lower leg: No edema.     Left lower leg: No edema.  Lymphadenopathy:     Cervical: No cervical adenopathy.   Skin:    Findings: No rash.  Neurological:     General: No focal deficit present.     Mental Status: She is alert and oriented to person, place, and time.     Comments: Word naming 12/30 seconds Recall 3/3  Psychiatric:        Mood and Affect: Mood normal.        Behavior: Behavior normal.            Assessment & Plan:

## 2022-08-18 NOTE — Assessment & Plan Note (Signed)
Given the vocal cord paralysis, discussed empiric omeprazole for any symptoms

## 2022-08-18 NOTE — Progress Notes (Signed)
Hearing Screening - Comments:: Passed whisper test Vision Screening - Comments:: May 2023  Hearing Screening   500Hz  1000Hz  2000Hz  4000Hz   Right ear 20 20 20 20   Left ear 40 20 20 20   Comments: Passed whisper test  Vision Screening   Right eye Left eye Both eyes  Without correction     With correction 20/20 20/20 20/20   Comments: May 2023

## 2022-08-18 NOTE — Assessment & Plan Note (Signed)
BP Readings from Last 3 Encounters:  08/18/22 118/82  03/19/21 126/80  03/17/20 128/80   Good control on the olmesartan 20 Will check labs

## 2022-08-18 NOTE — Assessment & Plan Note (Signed)
Uses the CPAP nightly with good results 

## 2022-08-18 NOTE — Assessment & Plan Note (Signed)
Seems euthyroid on the levothyroxine 88

## 2022-08-18 NOTE — Assessment & Plan Note (Signed)
I have personally reviewed the Medicare Annual Wellness questionnaire and have noted 1. The patient's medical and social history 2. Their use of alcohol, tobacco or illicit drugs 3. Their current medications and supplements 4. The patient's functional ability including ADL's, fall risks, home safety risks and hearing or visual             impairment. 5. Diet and physical activities 6. Evidence for depression or mood disorders  The patients weight, height, BMI and visual acuity have been recorded in the chart I have made referrals, counseling and provided education to the patient based review of the above and I have provided the pt with a written personalized care plan for preventive services.  I have provided you with a copy of your personalized plan for preventive services. Please take the time to review along with your updated medication list.  Will need one last colon in about 7 years (had 1 polyp last year) Mammograms every 1-2 years till 41 (last 1 year ago) Done with paps Really needs to do some exercise Prefers no COVID or RSV vaccines--discussed Flu vaccine in the fall

## 2022-08-18 NOTE — Assessment & Plan Note (Signed)
From the thyroid surgery Needs to be careful with swallowing

## 2022-08-22 ENCOUNTER — Telehealth: Payer: Self-pay

## 2022-08-22 MED ORDER — LEVOTHYROXINE SODIUM 75 MCG PO TABS: 75.0000 ug | ORAL_TABLET | Freq: Every day | ORAL | 3 refills | Status: DC

## 2022-08-22 NOTE — Telephone Encounter (Signed)
-----   Message from Karie Schwalbe, MD sent at 08/18/2022  5:11 PM EDT ----- Results released  Please send new Rx for levothyroxine 75 mcg Recheck 4-6 weeks--make sure no biotin

## 2022-08-22 NOTE — Telephone Encounter (Signed)
Left message to call back to schedule her for a non-fasting lab in 4-6 weeks.  New levothyroxine has been sent to the pharmacy.

## 2022-08-23 NOTE — Telephone Encounter (Signed)
Left message on VM, again, asking pt to call me back.

## 2022-08-24 NOTE — Telephone Encounter (Signed)
Pt called back returning Shannon's missed call regarding lab results. Told pt [place]'s response. Pt had no questions/concerns. Scheduled lab visit for 09/29/22. Call back # [phone]

## 2022-09-29 ENCOUNTER — Other Ambulatory Visit (INDEPENDENT_AMBULATORY_CARE_PROVIDER_SITE_OTHER): Payer: Medicare Other

## 2022-09-29 DIAGNOSIS — E039 Hypothyroidism, unspecified: Secondary | ICD-10-CM

## 2022-09-29 LAB — T4, FREE: Free T4: 1.12 ng/dL (ref 0.60–1.60)

## 2022-09-29 LAB — TSH: TSH: 1 u[IU]/mL (ref 0.35–5.50)

## 2022-11-08 ENCOUNTER — Telehealth: Payer: Self-pay | Admitting: Internal Medicine

## 2022-11-08 NOTE — Telephone Encounter (Signed)
Pt called stating Dr. Alphonsus Sias once told her if her CPAP was to ever mess up or stop working, to let him know, he could possibly help her out with receiving a new one. Pt states her CPAP died last night, 12/02/2022, she stated it gives her a error message regarding the motor. Pt stated she has had the CPAP for over 20 years now. Please advise.Call back # [phone]

## 2022-11-09 NOTE — Telephone Encounter (Signed)
Left message on VM per DPR that Dr Alphonsus Sias was out of the office this week. Asked her to let us know if the machine has a card that can give her pressures or if she knew her pressures. If not, she may end up having to see a sleep specialist or have another sleep study.

## 2022-11-15 NOTE — Telephone Encounter (Signed)
Left message on Vm per DPR for pt with Dr Karle Starch note.

## 2022-11-24 ENCOUNTER — Other Ambulatory Visit: Payer: Self-pay | Admitting: Internal Medicine

## 2023-01-21 ENCOUNTER — Ambulatory Visit
Admission: EM | Admit: 2023-01-21 | Discharge: 2023-01-21 | Disposition: A | Discharge status: Home / Self Care | Payer: Medicare Other | Attending: Family Medicine | Admitting: Family Medicine

## 2023-01-21 DIAGNOSIS — M545 Low back pain, unspecified: Secondary | ICD-10-CM

## 2023-01-21 MED ORDER — PREDNISONE 20 MG PO TABS: 20.0000 mg | ORAL_TABLET | Freq: Every day | ORAL | 0 refills | Status: AC

## 2023-01-21 MED ORDER — CYCLOBENZAPRINE HCL 10 MG PO TABS: 10.0000 mg | ORAL_TABLET | Freq: Two times a day (BID) | ORAL | 0 refills | Status: DC | PRN

## 2023-01-21 MED ORDER — DEXAMETHASONE SODIUM PHOSPHATE 10 MG/ML IJ SOLN
10.0000 mg | Freq: Once | INTRAMUSCULAR | Status: AC
  Administered 2023-01-21: 10 mg via INTRAMUSCULAR

## 2023-01-21 NOTE — ED Provider Notes (Signed)
Mary Munoz    CSN: [id number] Arrival date & time: 01/21/23  1102      History   Chief Complaint Chief Complaint  Patient presents with   Back Pain    HPI Mary Munoz is a 67 y.o. female.   The history is provided by the patient.  Back Pain Location:  Lumbar spine Quality:  Aching, burning and shooting Radiates to:  Does not radiate Pain severity:  Severe Pain is:  Same all the time Duration:  2 days Timing:  Constant Progression:  Worsening Chronicity:  Recurrent Context: lifting heavy objects   Relieved by: standing. Worsened by:  Sitting, lying down and movement Ineffective treatments:  NSAIDs  History of recurrent back pain secondary to degenerative disc disease.  Endorses previous resolution of acute back pain with prednisone and muscle relaxers.  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     GERD (gastroesophageal reflux disease)    History of shingles ? 2010   NO RESIDUAL PROBLEMS FROM SHINGLES   Hypercholesterolemia    Hypertension    Hypothyroidism    Obesity    OSA on CPAP    Vertigo    Vestibular neuronitis     Patient Active Problem List   Diagnosis Date Noted   OSA on CPAP 08/18/2022   Essential hypertension, benign 03/17/2020   Preventative health care 05/16/2018   Postoperative hypothyroidism 05/16/2018   Gastroesophageal reflux disease without esophagitis 07/21/2015   Globus pharyngeus 07/21/2015   Vocal cord paralysis, unilateral complete 07/21/2015   S/P partial thyroidectomy 08/13/2014   Seborrheic keratosis 07/23/2013   OA (osteoarthritis) of hip 08/08/2012    Past Surgical History:  Procedure Laterality Date   BREAST BIOPSY  1974   BENIGN   DILATION AND CURETTAGE OF UTERUS  09/21/11   D&C AND HYSTEROSCOPY FOR POSTMENOPAUSAL BLEEDING    MICROLARYNGOSCOPY W/VOCAL CORD INJECTION Right 11/27/2015   Procedure: MICROLARYNGOSCOPY WITH RIGHT VOCAL CORD INJECTION MEDIALIZATION;  Surgeon: Osborn Coho, MD;  Location: [facility] OR;  Service: ENT;  Laterality: Right;   THYROIDECTOMY N/A 08/13/2014   Procedure: RIGHT THYROIDECTOMY;  Surgeon: Axel Filler, MD;  Location: [facility];  Service: General;  Laterality: N/A;   TOTAL HIP ARTHROPLASTY Left 08/08/2012   Procedure: TOTAL HIP ARTHROPLASTY ANTERIOR APPROACH;  Surgeon: Loanne Drilling, MD;  Location: [facility];  Service: Orthopedics;  Laterality: Left;   TOTAL HIP ARTHROPLASTY Right 01/23/2013   Procedure: RIGHT TOTAL HIP ARTHROPLASTY ANTERIOR APPROACH;  Surgeon: Loanne Drilling, MD;  Location: [facility];  Service: Orthopedics;  Laterality: Right;   TUBAL LIGATION Bilateral 1985    OB History   No obstetric history on file.      Home Medications    Prior to Admission medications   Medication Sig Start Date End Date Taking? Authorizing Provider  cyclobenzaprine (FLEXERIL) 10 MG tablet Take 1 tablet (10 mg total) by mouth 2 (two) times daily as needed for muscle spasms. 01/21/23  Yes Bing Neighbors, NP  levothyroxine (SYNTHROID) 75 MCG tablet Take 1 tablet (75 mcg total) by mouth daily. 08/22/22  Yes Karie Schwalbe, MD  olmesartan (BENICAR) 20 MG tablet Take 1 tablet (20 mg total) by mouth daily. 11/24/22  Yes Karie Schwalbe, MD  predniSONE (DELTASONE) 20 MG tablet Take 1  tablet (20 mg total) by mouth daily with breakfast for 5 days. 01/21/23 01/26/23 Yes Bing Neighbors, NP    Family History Family History  Problem Relation Age of Onset   Dementia Mother    Hypertension Mother    Diabetes Mother    Thyroid disease Mother    CVA Mother    Alzheimer's disease Mother    Hypertension Father    Heart attack Father    Stroke Father    Colon cancer Sister    Thyroid disease Sister    Heart disease Sister        CABG   Heart attack Sister 72   Thyroid disease Sister    Dementia  Sister    Cancer Neg Hx     Social History Social History   Tobacco Use   Smoking status: Never    Passive exposure: Past   Smokeless tobacco: Never  Substance Use Topics   Alcohol use: No   Drug use: No     Allergies   Citric acid, Sulfa antibiotics, and Sulfacetamide sodium   Review of Systems Review of Systems  Musculoskeletal:  Positive for back pain.     Physical Exam Triage Vital Signs ED Triage Vitals  Encounter Vitals Group     BP 01/21/23 1256 104/72     Systolic BP Percentile --      Diastolic BP Percentile --      Pulse Rate 01/21/23 1256 87     Resp 01/21/23 1256 17     Temp 01/21/23 1256 97.8 F (36.6 C)     Temp Source 01/21/23 1256 Oral     SpO2 01/21/23 1256 99 %     Weight --      Height --      Head Circumference --      Peak Flow --      Pain Score 01/21/23 1307 9     Pain Loc --      Pain Education --      Exclude from Growth Chart --    No data found.  Updated Vital Signs BP 104/72 (BP Location: Right Arm)   Pulse 87   Temp 97.8 F (36.6 C) (Oral)   Resp 17   SpO2 99%   Visual Acuity Right Eye Distance:   Left Eye Distance:   Bilateral Distance:    Right Eye Near:   Left Eye Near:    Bilateral Near:     Physical Exam Vitals reviewed.  Constitutional:      Appearance: Normal appearance.  HENT:     Head: Normocephalic and atraumatic.  Eyes:     Extraocular Movements: Extraocular movements intact.     Pupils: Pupils are equal, round, and reactive to light.  Cardiovascular:     Rate and Rhythm: Normal rate and regular rhythm.  Pulmonary:     Effort: Pulmonary effort is normal.     Breath sounds: Normal breath sounds.  Musculoskeletal:     Lumbar back: Tenderness present. Decreased range of motion. Positive right straight leg raise test and positive left straight leg raise test.  Neurological:     General: No focal deficit present.     Mental Status: She is alert.    UC Treatments / Results  Labs (all labs  ordered are listed, but only abnormal results are displayed) Labs Reviewed - No data to display  EKG   Radiology No results found.  Procedures Procedures (including critical care time)  Medications Ordered in UC  Medications  dexamethasone (DECADRON) injection 10 mg (10 mg Intramuscular Given 01/21/23 1339)    Initial Impression / Assessment and Plan / UC Course  I have reviewed the triage vital signs and the nursing notes.  Pertinent labs & imaging results that were available during my care of the patient were reviewed by me and considered in my medical decision making (see chart for details).    Acute Right Sided Low-Back Pain without Sciatica  -Decadron 10 mg IM  Treatment per discharge medication orders. Return precautions given if symptoms worsen or do no improve. Final Clinical Impressions(s) / UC Diagnoses   Final diagnoses:  Acute right-sided low back pain without sciatica     Discharge Instructions      Start oral prednisone tomorrow if your back pain symptoms have not improved.  You received a Decadron injection which is a long-acting steroid that will reduce the inflammation is causing and have the back pain.  You may take Tylenol as needed for pain and I prescribed a muscle relaxer as well for you to take as directed.  Keep in mind that muscle relaxers can cause drowsiness, refrain from driving while taking medication.     ED Prescriptions     Medication Sig Dispense Auth. Provider   predniSONE (DELTASONE) 20 MG tablet Take 1 tablet (20 mg total) by mouth daily with breakfast for 5 days. 5 tablet Bing Neighbors, NP   cyclobenzaprine (FLEXERIL) 10 MG tablet Take 1 tablet (10 mg total) by mouth 2 (two) times daily as needed for muscle spasms. 20 tablet Bing Neighbors, NP      PDMP not reviewed this encounter.   Bing Neighbors, NP 01/23/23 1201

## 2023-01-21 NOTE — ED Triage Notes (Signed)
Pt presents with right sided back pain that started yesterday. Pt states she was walking and sat down then felt a sharp pain when trying to get up. Taking tylenol with little relief.

## 2023-01-21 NOTE — Discharge Instructions (Signed)
Start oral prednisone tomorrow if your back pain symptoms have not improved.  You received a Decadron injection which is a long-acting steroid that will reduce the inflammation is causing and have the back pain.  You may take Tylenol as needed for pain and I prescribed a muscle relaxer as well for you to take as directed.  Keep in mind that muscle relaxers can cause drowsiness, refrain from driving while taking medication.

## 2023-05-18 ENCOUNTER — Ambulatory Visit: Payer: BLUE CROSS/BLUE SHIELD | Admitting: Internal Medicine

## 2023-07-03 ENCOUNTER — Telehealth: Payer: Self-pay | Admitting: Internal Medicine

## 2023-07-03 NOTE — Telephone Encounter (Signed)
 Patient dropped of form to be completed by provider placed in providers box at front desk

## 2023-07-04 NOTE — Telephone Encounter (Signed)
Form placed in Dr Letvak's inbox on his desk 

## 2023-07-05 NOTE — Telephone Encounter (Signed)
 Left message to call office

## 2023-07-18 ENCOUNTER — Telehealth: Payer: Self-pay

## 2023-07-18 NOTE — Telephone Encounter (Signed)
Left message to call office, again. 

## 2023-07-18 NOTE — Telephone Encounter (Signed)
 Copied from CRM [id number]. Topic: General - Other >> Jul 18, 2023  2:50 PM Truddie Crumble wrote: Reason for CRM: patient called back and she stated she has had placard for three years  and she was just renewing it

## 2023-07-18 NOTE — Telephone Encounter (Signed)
 Copied from CRM [id number]. Topic: General - Other >> Jul 18, 2023  2:50 PM Mary Munoz wrote: Reason for CRM: patient called back and she stated she has had placard for three years  and she was just renewing it

## 2023-07-18 NOTE — Telephone Encounter (Signed)
 This message copied and pasted in message already open about this.

## 2023-08-24 ENCOUNTER — Encounter: Payer: BLUE CROSS/BLUE SHIELD | Admitting: Internal Medicine

## 2023-08-29 ENCOUNTER — Ambulatory Visit (INDEPENDENT_AMBULATORY_CARE_PROVIDER_SITE_OTHER): Payer: BLUE CROSS/BLUE SHIELD | Admitting: Internal Medicine

## 2023-08-29 ENCOUNTER — Encounter: Payer: Self-pay | Admitting: Internal Medicine

## 2023-08-29 VITALS — BP 102/60 | HR 85 | Temp 98.6°F | Ht 61.75 in | Wt 175.0 lb

## 2023-08-29 DIAGNOSIS — I10 Essential (primary) hypertension: Secondary | ICD-10-CM | POA: Diagnosis not present

## 2023-08-29 DIAGNOSIS — E890 Postprocedural hypothyroidism: Secondary | ICD-10-CM | POA: Diagnosis not present

## 2023-08-29 DIAGNOSIS — G4733 Obstructive sleep apnea (adult) (pediatric): Secondary | ICD-10-CM | POA: Diagnosis not present

## 2023-08-29 DIAGNOSIS — Z0000 Encounter for general adult medical examination without abnormal findings: Secondary | ICD-10-CM | POA: Diagnosis not present

## 2023-08-29 DIAGNOSIS — J3801 Paralysis of vocal cords and larynx, unilateral: Secondary | ICD-10-CM

## 2023-08-29 LAB — HM HEPATITIS C SCREENING LAB: HM Hepatitis Screen: NEGATIVE

## 2023-08-29 LAB — LIPID PANEL
Cholesterol: 181 mg/dL (ref 0–200)
HDL: 46.5 mg/dL (ref >39.00)
LDL Cholesterol: 114 mg/dL — ABNORMAL HIGH (ref 0–99)
NonHDL: 134.21
Total CHOL/HDL Ratio: 4
Triglycerides: 102 mg/dL (ref 0.0–149.0)
VLDL: 20.4 mg/dL (ref 0.0–40.0)

## 2023-08-29 LAB — COMPREHENSIVE METABOLIC PANEL WITH GFR
ALT: 10 U/L (ref 0–35)
AST: 14 U/L (ref 0–37)
Albumin: 4.1 g/dL (ref 3.5–5.2)
Alkaline Phosphatase: 91 U/L (ref 39–117)
BUN: 13 mg/dL (ref 6–23)
CO2: 27 meq/L (ref 19–32)
Calcium: 9 mg/dL (ref 8.4–10.5)
Chloride: 107 meq/L (ref 96–112)
Creatinine, Ser: 0.93 mg/dL (ref 0.40–1.20)
GFR: 62.77 mL/min (ref >60.00)
Glucose, Bld: 86 mg/dL (ref 70–99)
Potassium: 4.5 meq/L (ref 3.5–5.1)
Sodium: 141 meq/L (ref 135–145)
Total Bilirubin: 0.4 mg/dL (ref 0.2–1.2)
Total Protein: 6.4 g/dL (ref 6.0–8.3)

## 2023-08-29 LAB — CBC
HCT: 37.2 % (ref 36.0–46.0)
Hemoglobin: 12.3 g/dL (ref 12.0–15.0)
MCHC: 33 g/dL (ref 30.0–36.0)
MCV: 87.2 fl (ref 78.0–100.0)
Platelets: 287 10*3/uL (ref 150.0–400.0)
RBC: 4.27 Mil/uL (ref 3.87–5.11)
RDW: 15.7 % — ABNORMAL HIGH (ref 11.5–15.5)
WBC: 6.6 10*3/uL (ref 4.0–10.5)

## 2023-08-29 LAB — TSH: TSH: 3.78 u[IU]/mL (ref 0.35–5.50)

## 2023-08-29 LAB — T4, FREE: Free T4: 0.98 ng/dL (ref 0.60–1.60)

## 2023-08-29 NOTE — Assessment & Plan Note (Signed)
 BP Readings from Last 3 Encounters:  08/29/23 102/60  01/21/23 104/72  08/18/22 118/82   Controlled on olmesartan 20mg  daily

## 2023-08-29 NOTE — Assessment & Plan Note (Signed)
 Generally does well with nightly CPAP

## 2023-08-29 NOTE — Assessment & Plan Note (Signed)
 Seems to be euthyroid on lower dose--40mcg

## 2023-08-29 NOTE — Assessment & Plan Note (Signed)
 I have personally reviewed the Medicare Annual Wellness questionnaire and have noted 1. The patient's medical and social history 2. Their use of alcohol, tobacco or illicit drugs 3. Their current medications and supplements 4. The patient's functional ability including ADL's, fall risks, home safety risks and hearing or visual             impairment. 5. Diet and physical activities 6. Evidence for depression or mood disorders  The patients weight, height, BMI and visual acuity have been recorded in the chart I have made referrals, counseling and provided education to the patient based review of the above and I have provided the pt with a written personalized care plan for preventive services.  I have provided you with a copy of your personalized plan for preventive services. Please take the time to review along with your updated medication list.  Colonoscopy due in about 6 years Mammogram just scheduled in May--should be every 2 years till at least 38 Done with paps Discussed exercise Prefers no COVID vaccine Flu vaccine in the fall

## 2023-08-29 NOTE — Progress Notes (Signed)
 Subjective:    Patient ID: Mary Munoz, female    DOB: 1955-05-10, 68 y.o.   MRN: [id number]  HPI Here for Medicare wellness visit and follow up of chronic health conditions Reviewed advanced directives Reviewed other doctors---Dr Schooler--GI, [facility], Dr Diane Forte, [facility] dermatology No hospitalizations or surgery in the past year No set exercise but still works full time---runs after great grandkids No alcohol or tobacco Vision is okay Hearing is good No falls No depression or anhedonia Independent with instrumental ADLs No memory issues  Tries to walk in parking lots---but in winter, has some trouble so uses the handicapped parking permit at times Will have some SOB with her throat issues  Careful with swallowing--no aspiration  On the mildly reduced thyroid  dose Energy stable Working hard on eating right---has 4 great grandchildren that come regularly  No chest pain or SOB No dizziness or syncope No palpitations No edema No headaches  Current Outpatient Medications on File Prior to Visit  Medication Sig Dispense Refill   cyclobenzaprine  (FLEXERIL ) 10 MG tablet Take 1 tablet (10 mg total) by mouth 2 (two) times daily as needed for muscle spasms. 20 tablet 0   levothyroxine  (SYNTHROID ) 75 MCG tablet Take 1 tablet (75 mcg total) by mouth daily. 90 tablet 3   olmesartan (BENICAR) 20 MG tablet Take 1 tablet (20 mg total) by mouth daily. 90 tablet 3   No current facility-administered medications on file prior to visit.    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     GERD (gastroesophageal reflux disease)    History of shingles ? 2010   NO RESIDUAL PROBLEMS FROM SHINGLES   Hypercholesterolemia    Hypertension    Hypothyroidism    Obesity    OSA on CPAP    Vertigo    Vestibular neuronitis     Past Surgical History:  Procedure Laterality Date   BREAST BIOPSY  1974   BENIGN   DILATION AND CURETTAGE OF UTERUS  09/21/11   D&C AND HYSTEROSCOPY FOR POSTMENOPAUSAL BLEEDING   MICROLARYNGOSCOPY W/VOCAL CORD INJECTION Right 11/27/2015   Procedure: MICROLARYNGOSCOPY WITH RIGHT VOCAL CORD INJECTION MEDIALIZATION;  Surgeon: Ammon Bales, MD;  Location: [facility] OR;  Service: ENT;  Laterality: Right;   THYROIDECTOMY N/A 08/13/2014   Procedure: RIGHT THYROIDECTOMY;  Surgeon: Shela Derby, MD;  Location: [facility];  Service: General;  Laterality: N/A;   TOTAL HIP ARTHROPLASTY Left 08/08/2012   Procedure: TOTAL HIP ARTHROPLASTY ANTERIOR APPROACH;  Surgeon: Aurther Blue, MD;  Location: [facility];  Service: Orthopedics;  Laterality: Left;   TOTAL HIP ARTHROPLASTY Right 01/23/2013   Procedure: RIGHT TOTAL HIP ARTHROPLASTY ANTERIOR APPROACH;  Surgeon: Aurther Blue, MD;  Location: [facility];  Service: Orthopedics;  Laterality: Right;   TUBAL LIGATION Bilateral 1985    Family History  Problem Relation Age of Onset   Dementia Mother    Hypertension Mother    Diabetes Mother    Thyroid  disease Mother    CVA  Mother    Alzheimer's disease Mother    Hypertension Father    Heart attack Father    Stroke Father    Colon cancer Sister    Thyroid  disease Sister    Heart disease Sister        CABG   Heart attack Sister 66   Thyroid  disease Sister    Dementia Sister    Lung disease Son    Cancer Neg Hx     Social History   Socioeconomic History   Marital status: Married    Spouse name: Not on file   Number of children: 1   Years of education: college   Highest education level: Not on file  Occupational History   Occupation:  Bookkeeper/Payroll    Employer: [employer]  Tobacco Use   Smoking status: Never    Passive exposure: Past   Smokeless tobacco: Never  Substance and Sexual Activity   Alcohol use: No   Drug use: No   Sexual activity: Yes    Partners: Male    Birth control/protection: Post-menopausal, Surgical    Comment: BTL  Other Topics Concern   Not on file  Social History Narrative   Married   Right handed   Caffeine use- 1 cup coffee daily, occasional tea      No living will   Husband to be decision maker--alternate is son   Would accept resuscitation but no prolonged machines or tube feeds if cognitively unaware   Social Drivers of Corporate investment banker Strain: Not on file  Food Insecurity: Not on file  Transportation Needs: Not on file  Physical Activity: Not on file  Stress: Not on file  Social Connections: Not on file  Intimate Partner Violence: Not on file   Review of Systems Appetite is okay Gained a few pounds back Sleeps fair---but some 3AM awakening and can't get back to sleep. Goes to sleep 9:30-10. No daytime somnolence Uses the CPAP nightly--and it helps Wears seat belt Teeth are fine--keeps up with dentist No heartburn Bowels move fine--no blood Voids fine--no incontinence No sig back or joint pains No suspicious skin lesions--does see derm for routine check    Objective:   Physical Exam Constitutional:      Appearance: Normal appearance.  HENT:     Mouth/Throat:     Pharynx: No oropharyngeal exudate or posterior oropharyngeal erythema.  Eyes:     Conjunctiva/sclera: Conjunctivae normal.     Pupils: Pupils are equal, round, and reactive to light.  Cardiovascular:     Rate and Rhythm: Normal rate and regular rhythm.     Pulses: Normal pulses.     Heart sounds: No murmur heard.    No gallop.  Pulmonary:     Effort: Pulmonary effort is normal.     Breath sounds: Normal breath sounds. No wheezing or rales.  Abdominal:     Palpations: Abdomen is  soft.     Tenderness: There is no abdominal tenderness.  Musculoskeletal:     Cervical back: Neck supple.     Right lower leg: No edema.     Left lower leg: No edema.  Lymphadenopathy:     Cervical: No cervical adenopathy.  Skin:    Findings: No rash.  Neurological:     General: No focal deficit present.     Mental Status: She is alert and oriented to person, place, and time.     Comments: Mini-cog----normal  Psychiatric:        Mood and  Affect: Mood normal.        Behavior: Behavior normal.            Assessment & Plan:

## 2023-08-29 NOTE — Progress Notes (Signed)
 Hearing Screening - Comments:: Passed whisper test Vision Screening - Comments:: April 2024. Making appt for this year

## 2023-08-29 NOTE — Assessment & Plan Note (Signed)
 Gives her SOB at times when hurrying Will give handicapped placard for occasional use

## 2023-08-30 ENCOUNTER — Encounter: Payer: Self-pay | Admitting: Internal Medicine

## 2023-09-06 ENCOUNTER — Other Ambulatory Visit: Payer: Self-pay | Admitting: Internal Medicine

## 2023-09-06 DIAGNOSIS — Z1231 Encounter for screening mammogram for malignant neoplasm of breast: Secondary | ICD-10-CM

## 2023-09-08 ENCOUNTER — Ambulatory Visit
Admission: RE | Admit: 2023-09-08 | Discharge: 2023-09-08 | Disposition: A | Discharge status: Home / Self Care | Source: Ambulatory Visit | Attending: Internal Medicine | Admitting: Internal Medicine

## 2023-09-08 DIAGNOSIS — Z1231 Encounter for screening mammogram for malignant neoplasm of breast: Secondary | ICD-10-CM

## 2023-09-13 ENCOUNTER — Other Ambulatory Visit: Payer: Self-pay | Admitting: Internal Medicine

## 2023-09-13 ENCOUNTER — Encounter: Payer: Self-pay | Admitting: Internal Medicine

## 2023-09-13 DIAGNOSIS — R928 Other abnormal and inconclusive findings on diagnostic imaging of breast: Secondary | ICD-10-CM

## 2023-09-28 ENCOUNTER — Ambulatory Visit

## 2023-09-28 ENCOUNTER — Ambulatory Visit: Payer: Self-pay | Admitting: Internal Medicine

## 2023-09-28 ENCOUNTER — Ambulatory Visit
Admission: RE | Admit: 2023-09-28 | Discharge: 2023-09-28 | Disposition: A | Discharge status: Home / Self Care | Source: Ambulatory Visit | Attending: Internal Medicine | Admitting: Internal Medicine

## 2023-09-28 DIAGNOSIS — R928 Other abnormal and inconclusive findings on diagnostic imaging of breast: Secondary | ICD-10-CM

## 2023-11-15 ENCOUNTER — Other Ambulatory Visit: Payer: Self-pay | Admitting: Internal Medicine

## 2023-12-02 ENCOUNTER — Ambulatory Visit: Payer: Self-pay

## 2023-12-02 ENCOUNTER — Ambulatory Visit
Admission: EM | Admit: 2023-12-02 | Discharge: 2023-12-02 | Disposition: A | Discharge status: Home / Self Care | Attending: Physician Assistant | Admitting: Physician Assistant

## 2023-12-02 DIAGNOSIS — S39012A Strain of muscle, fascia and tendon of lower back, initial encounter: Secondary | ICD-10-CM

## 2023-12-02 DIAGNOSIS — M545 Low back pain, unspecified: Secondary | ICD-10-CM

## 2023-12-02 MED ORDER — TIZANIDINE HCL 4 MG PO TABS
4.0000 mg | ORAL_TABLET | Freq: Two times a day (BID) | ORAL | 0 refills | Status: AC | PRN
Start: 2023-12-02 — End: ?

## 2023-12-02 MED ORDER — LIDOCAINE 5 % EX PTCH: 1.0000 | MEDICATED_PATCH | CUTANEOUS | 0 refills | Status: DC

## 2023-12-02 MED ORDER — DEXAMETHASONE SODIUM PHOSPHATE 10 MG/ML IJ SOLN
10.0000 mg | Freq: Once | INTRAMUSCULAR | Status: AC
  Administered 2023-12-02: 10 mg via INTRAMUSCULAR

## 2023-12-02 NOTE — ED Triage Notes (Signed)
 Patient to Urgent Care with complaints of right sided, lower back pain. Denies any radiation. Hx of the same.   Symptoms x1 week. Denies any injury.   Taking extra-strength tylenol .

## 2023-12-02 NOTE — Discharge Instructions (Addendum)
 I believe that you have injured the muscles in your back.  We gave you an injection of steroids today to help with the pain and inflammation.  You can use acetaminophen /Tylenol  for breakthrough pain.  Take Zanaflex  up to 2 times a day.  This will make you sleepy so do not drive or drink alcohol taking it.  Apply lidocaine  patch for 12 hours during the day; remove this for 12 hours at night.  Use only 1 patch per 24 hours.  I recommend you follow-up with orthopedics; call to schedule an appointment.  If anything worsens and you have severe pain, going to the bathroom on yourself without noticing it, numbness or tingling in your legs, weakness in your legs, fever, nausea, vomiting you need to be seen immediately.

## 2023-12-02 NOTE — ED Provider Notes (Signed)
 CAY RALPH PELT    CSN: [id number] Arrival date & time: 12/02/23  1242      History   Chief Complaint Chief Complaint  Patient presents with   Back Pain    HPI Mary Munoz is a 68 y.o. female.   Patient presents today with a weeklong history of right sided lumbar back pain.  She denies any known injury or increase in activity prior to symptom onset.  She reports that pain is rated 8 on a 0-10 pain scale, described as intense aching, no aggravating or alleviating factors identified.  She denies any radiation of the pain including into her leg.  She has had similar episodes in the past and was last treated by our clinic in 2024 which she was given Decadron  and prednisone .  She reports that this quickly resolved her symptoms and is requesting a repeat of this treatment if appropriate.  She denies any history of diabetes.  She denies any alarm symptoms including fever, nausea, vomiting, urinary symptoms, bowel/bladder incontinence.  She has been taking Tylenol  without improvement of symptoms.  She denies any previous spinal injury or surgery.  She is able to perform her daily activities despite symptoms.    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     GERD (gastroesophageal reflux disease)    History of shingles ? 2010   NO RESIDUAL PROBLEMS FROM SHINGLES   Hypercholesterolemia    Hypertension    Hypothyroidism    Obesity    OSA on CPAP    Vertigo    Vestibular neuronitis     Patient Active Problem List   Diagnosis Date Noted   OSA on CPAP 08/18/2022   Essential hypertension, benign 03/17/2020   Preventative health care 05/16/2018   Postoperative hypothyroidism 05/16/2018   Gastroesophageal reflux disease without esophagitis 07/21/2015   Globus pharyngeus 07/21/2015   Vocal cord paralysis,  unilateral complete 07/21/2015   S/P partial thyroidectomy 08/13/2014   Seborrheic keratosis 07/23/2013   OA (osteoarthritis) of hip 08/08/2012    Past Surgical History:  Procedure Laterality Date   BREAST BIOPSY  1974   BENIGN   DILATION AND CURETTAGE OF UTERUS  09/21/11   D&C AND HYSTEROSCOPY FOR POSTMENOPAUSAL BLEEDING   MICROLARYNGOSCOPY W/VOCAL CORD INJECTION Right 11/27/2015   Procedure: MICROLARYNGOSCOPY WITH RIGHT VOCAL CORD INJECTION MEDIALIZATION;  Surgeon: Alm Bouche, MD;  Location: [facility] OR;  Service: ENT;  Laterality: Right;   THYROIDECTOMY N/A 08/13/2014   Procedure: RIGHT THYROIDECTOMY;  Surgeon: Lynda Leos, MD;  Location: [facility] ORS;  Service: General;  Laterality: N/A;   TOTAL HIP ARTHROPLASTY Left 08/08/2012   Procedure: TOTAL HIP ARTHROPLASTY ANTERIOR APPROACH;  Surgeon: Dempsey LULLA Moan, MD;  Location: [facility] OR[facility];  Service: Orthopedics;  Laterality: Left;   TOTAL HIP ARTHROPLASTY Right 01/23/2013   Procedure: RIGHT TOTAL HIP ARTHROPLASTY ANTERIOR APPROACH;  Surgeon: Dempsey LULLA Moan, MD;  Location: [facility] OR[facility];  Service: Orthopedics;  Laterality: Right;   TUBAL LIGATION Bilateral 1985    OB History   No obstetric history on file.      Home Medications    Prior to Admission medications   Medication Sig Start Date End Date Taking? Authorizing Provider  lidocaine  (LIDODERM ) 5 % Place 1 patch onto the skin daily. Remove & Discard patch within 12  hours or as directed by MD 12/02/23  Yes Lahoma Constantin K, PA-C  tiZANidine  (ZANAFLEX ) 4 MG tablet Take 1 tablet (4 mg total) by mouth 2 (two) times daily as needed for muscle spasms. 12/02/23  Yes Brick Ketcher K, PA-C  levothyroxine  (SYNTHROID ) 75 MCG tablet TAKE ONE TABLET (75 MCG TOTAL) BY MOUTH DAILY. 11/15/23   Jimmy Charlie FERNS, MD  olmesartan (BENICAR) 20 MG tablet TAKE ONE TABLET (20 MG TOTAL) BY MOUTH DAILY. 11/15/23   Jimmy Charlie FERNS, MD    Family History Family History  Problem Relation Age of Onset   Dementia Mother     Hypertension Mother    Diabetes Mother    Thyroid  disease Mother    CVA Mother    Alzheimer's disease Mother    Hypertension Father    Heart attack Father    Stroke Father    Colon cancer Sister    Thyroid  disease Sister    Heart disease Sister        CABG   Heart attack Sister 47   Thyroid  disease Sister    Dementia Sister    Lung disease Son    Cancer Neg Hx     Social History Social History   Tobacco Use   Smoking status: Never    Passive exposure: Past   Smokeless tobacco: Never  Substance Use Topics   Alcohol use: No   Drug use: No     Allergies   Citric acid, Sulfa antibiotics, and Sulfacetamide sodium   Review of Systems Review of Systems  Constitutional:  Positive for activity change. Negative for appetite change, fatigue and fever.  Gastrointestinal:  Negative for abdominal pain, nausea and vomiting.  Genitourinary:  Negative for dysuria, frequency and urgency.  Musculoskeletal:  Positive for back pain. Negative for arthralgias and myalgias.  Skin:  Negative for rash.  Neurological:  Negative for weakness and numbness.     Physical Exam Triage Vital Signs ED Triage Vitals  Encounter Vitals Group     BP 12/02/23 1257 132/82     Girls Systolic BP Percentile --      Girls Diastolic BP Percentile --      Boys Systolic BP Percentile --      Boys Diastolic BP Percentile --      Pulse Rate 12/02/23 1257 88     Resp 12/02/23 1257 18     Temp 12/02/23 1257 98 F (36.7 C)     Temp src --      SpO2 12/02/23 1257 97 %     Weight --      Height --      Head Circumference --      Peak Flow --      Pain Score 12/02/23 1256 8     Pain Loc --      Pain Education --      Exclude from Growth Chart --    No data found.  Updated Vital Signs BP 132/82   Pulse 88   Temp 98 F (36.7 C)   Resp 18   SpO2 97%   Visual Acuity Right Eye Distance:   Left Eye Distance:   Bilateral Distance:    Right Eye Near:   Left Eye Near:    Bilateral Near:      Physical Exam Vitals reviewed.  Constitutional:      General: She is awake. She is not in acute distress.    Appearance: Normal appearance. She is well-developed. She is not ill-appearing.  Comments: Very pleasant female appears stated age in no acute distress sitting comfortably in exam room  HENT:     Head: Normocephalic and atraumatic.  Cardiovascular:     Rate and Rhythm: Normal rate and regular rhythm.     Heart sounds: Normal heart sounds, S1 normal and S2 normal. No murmur heard. Pulmonary:     Effort: Pulmonary effort is normal.     Breath sounds: Normal breath sounds. No wheezing, rhonchi or rales.     Comments: Clear to auscultation bilaterally Musculoskeletal:     Cervical back: No tenderness or bony tenderness.     Thoracic back: No tenderness or bony tenderness.     Lumbar back: Tenderness present. No bony tenderness.     Comments: Back: No pain percussion of vertebrae.  No deformity or step-off noted.  Tenderness to palpation of right lumbar paraspinal muscles without spasm.  Strength 5/5 bilateral lower extremities.  Psychiatric:        Behavior: Behavior is cooperative.      UC Treatments / Results  Labs (all labs ordered are listed, but only abnormal results are displayed) Labs Reviewed - No data to display  EKG   Radiology No results found.  Procedures Procedures (including critical care time)  Medications Ordered in UC Medications  dexamethasone  (DECADRON ) injection 10 mg (10 mg Intramuscular Given 12/02/23 1332)    Initial Impression / Assessment and Plan / UC Course  I have reviewed the triage vital signs and the nursing notes.  Pertinent labs & imaging results that were available during my care of the patient were reviewed by me and considered in my medical decision making (see chart for details).     Patient is well-appearing, afebrile, nontoxic, nontachycardic.  She denies any alarm symptoms that warrant emergent evaluation or imaging.   Plain films were deferred as she denies any recent trauma and has no focal bony tenderness.  She has had significant improvement of symptoms with Decadron  in the past and so was given 10 mg in clinic today.  No indication for dose adjustment based on metabolic panel from 08/29/2023 with creatinine of 0.93 and calculated creatinine clearance of 71.54 mL/min.  She was also given a prescription for Zanaflex  to be taken up to twice a day.  We discussed that this can be sedating and she is not to drive or drink alcohol taking it.  She can use lidocaine  patches for additional symptom relief as well as over-the-counter Tylenol .  We discussed that if her symptoms are not improving she should follow-up with orthopedics and was given the contact information for local provider with instruction to call to schedule an appointment.  If she has any worsening or changing symptoms including bowel/bladder continence, lower extremity weakness, saddle anesthesia, fever, nausea, vomiting she needs to be seen immediately.  Strict return precautions given.  All questions were answered to patient satisfaction.  Final Clinical Impressions(s) / UC Diagnoses   Final diagnoses:  Acute right-sided low back pain without sciatica  Strain of lumbar region, initial encounter     Discharge Instructions      I believe that you have injured the muscles in your back.  We gave you an injection of steroids today to help with the pain and inflammation.  You can use acetaminophen /Tylenol  for breakthrough pain.  Take Zanaflex  up to 2 times a day.  This will make you sleepy so do not drive or drink alcohol taking it.  Apply lidocaine  patch for 12 hours during the day;  remove this for 12 hours at night.  Use only 1 patch per 24 hours.  I recommend you follow-up with orthopedics; call to schedule an appointment.  If anything worsens and you have severe pain, going to the bathroom on yourself without noticing it, numbness or tingling in your legs,  weakness in your legs, fever, nausea, vomiting you need to be seen immediately.       ED Prescriptions     Medication Sig Dispense Auth. Provider   lidocaine  (LIDODERM ) 5 % Place 1 patch onto the skin daily. Remove & Discard patch within 12 hours or as directed by MD 10 patch Yanixan Mellinger K, PA-C   tiZANidine  (ZANAFLEX ) 4 MG tablet Take 1 tablet (4 mg total) by mouth 2 (two) times daily as needed for muscle spasms. 10 tablet Shandel Busic K, PA-C      PDMP not reviewed this encounter.   Sherrell Rocky POUR, PA-C 12/02/23 1359

## 2024-06-14 ENCOUNTER — Encounter: Payer: Self-pay | Admitting: Sleep Medicine

## 2024-06-14 ENCOUNTER — Ambulatory Visit

## 2024-06-14 VITALS — BP 138/88 | HR 76 | Temp 98.0°F | Ht 61.75 in | Wt 175.0 lb

## 2024-06-14 DIAGNOSIS — J3801 Paralysis of vocal cords and larynx, unilateral: Secondary | ICD-10-CM

## 2024-06-14 DIAGNOSIS — E7800 Pure hypercholesterolemia, unspecified: Secondary | ICD-10-CM | POA: Insufficient documentation

## 2024-06-14 DIAGNOSIS — Z52000 Unspecified donor, whole blood: Secondary | ICD-10-CM

## 2024-06-14 DIAGNOSIS — E890 Postprocedural hypothyroidism: Secondary | ICD-10-CM

## 2024-06-14 DIAGNOSIS — G4733 Obstructive sleep apnea (adult) (pediatric): Secondary | ICD-10-CM

## 2024-06-14 DIAGNOSIS — Z131 Encounter for screening for diabetes mellitus: Secondary | ICD-10-CM

## 2024-06-14 DIAGNOSIS — I10 Essential (primary) hypertension: Secondary | ICD-10-CM

## 2024-06-14 DIAGNOSIS — E669 Obesity, unspecified: Secondary | ICD-10-CM | POA: Insufficient documentation

## 2024-06-14 NOTE — Assessment & Plan Note (Signed)
 Diagnosed with OSA based on sleep study over 15 years ago, has been compliant with CPAP up until it stopped working recently, will refer to pulmonary for repeat sleep study and replacement of machine, as well as future maintenance.  Her current fatigue is likely secondary to nonrestorative sleep due to being unable to properly use CPAP, will rule out any contributing anemia which she is also at risk for given that she donates whole blood every 8 weeks.  Orders:   Ambulatory referral to Pulmonology   CBC with Differential; Future

## 2024-06-14 NOTE — Progress Notes (Signed)
 "  Subjective:   This visit was conducted in person. The patient gave informed consent to the use of Abridge AI technology to record the contents of the encounter as documented below.   Patient ID: Mary Munoz, female    DOB: [birth year], 69 y.o.   MRN: [id number]   Discussed the use of AI scribe software for clinical note transcription with the patient, who gave verbal consent to proceed.  History of Present Illness Mary Munoz is a 69 year old female with obstructive sleep apnea who presents with issues related to her CPAP machine.  She has been experiencing issues with her CPAP machine for about a year. The machine malfunctions, described as 'throwing water ' and making 'gurgling sounds,' causing her to feel as though she is 'almost drowning' at night. She reports feeling more tired recently and that her sleep quality has gone down, as she is unable to use the machine throughout the night.  She has a history of vocal cord paralysis on one side, which occurred in 2017 following surgery for thyroid  cancer. During the procedure, a nerve was clipped, leading to paralysis. Part of her thyroid  was removed, and she is currently on 75 micrograms of thyroid  medication daily, taken alongside her blood pressure medication in the morning.  She has a history of arthritis in both hips and has undergone bilateral hip replacements over ten years ago. Post-surgery, her hip pain has improved, but she experiences discomfort when walking on concrete or for extended periods, such as during a visit to the zoo, necessitating the use of a cane.  Her medication regimen includes olmesartan for blood pressure, taken once daily, and tizanidine  as needed for muscle spasms, which occur infrequently, about once or twice a year, often triggered by prolonged sitting in certain chairs.  She has a history of vertigo, which occurred in 2017 following her thyroid  surgery, but has since resolved. She also had shingles  in 2010 and received the shingles vaccine in 2021 and 2022.  Her family history includes dementia, diabetes, and thyroid  disease in her mother, and thyroid  disease, dementia, and Alzheimer's in her sisters. One sister had colon cancer at age 42.  She does not consume alcohol or smoke. She lives with her spouse, Vienna Folden, and she is currently sexually active. She donates blood regularly, approximately every eight weeks.      Review of Systems  All other systems reviewed and are negative.       Allergies[1]  Medications Ordered Prior to Encounter[2]  BP 138/88 (BP Location: Left Arm, Patient Position: Sitting, Cuff Size: Normal)   Pulse 76   Temp 98 F (36.7 C) (Oral)   Ht 5' 1.75 (1.568 m)   Wt 175 lb (79.4 kg)   SpO2 97%   BMI 32.27 kg/m   Objective:      Physical Exam GENERAL: Alert, cooperative, well developed, no acute distress. HEAD: Normocephalic atraumatic. EYES: Extraocular movements intact BL, pupils round, equal and reactive to light BL, conjunctivae normal BL. EXTREMITIES: No cyanosis or edema. NEUROLOGICAL: Oriented to person, place and time, no gait abnormalities, moves all extremities without gross motor or sensory deficit.         Assessment & Plan:   Been compliant, filling with water /gurgling sounds, for about a year, using initially then takes off middle of night,     Assessment & Plan OSA on CPAP Whole blood donor Diagnosed with OSA based on sleep study over 15 years ago, has been compliant with CPAP  up until it stopped working recently, will refer to pulmonary for repeat sleep study and replacement of machine, as well as future maintenance.  Her current fatigue is likely secondary to nonrestorative sleep due to being unable to properly use CPAP, will rule out any contributing anemia which she is also at risk for given that she donates whole blood every 8 weeks.  Orders:   Ambulatory referral to Pulmonology   CBC with Differential;  Future  Postoperative hypothyroidism Vocal cord paralysis, unilateral complete Status post partial thyroidectomy over 10 years ago, which she sustained iatrogenic injury to one of her vocal cords, has not been taking her Synthroid  separately from other meds, counseled to take on an empty stomach an hour before other medications or food, will repeat TSH in 8 weeks time.  - Continue Synthroid  75 mcg daily  Orders:   TSH; Future  Obesity (BMI 30-39.9) Essential hypertension, benign Normotensive this visit at 138/88 within age-appropriate goal.  No changes to regimen, ordering labs for monitoring of kidney, liver function and electrolytes.  - Continue olmesartan 20 mg daily - Counseled about exercise and diet changes given BMI of 32  Orders:   Comprehensive metabolic panel with GFR; Future  Hypercholesterolemia Diabetes mellitus screening No A1c on file, ordered.  Lipid panel from several months ago shows elevated LDL, ASCVD risk high at 14.5%, not agreeable to initiate statin at this time, counseled about diet/lifestyle changes, plan to repeat lipid panel in a few months just prior to her physical.  Would initiate statin at that time if ASCVD risk greater than 10.  Orders:   Hemoglobin A1c; Future   Lipid panel; Future     Return in about 2 months (around 08/22/2024) for Schedule Fasting lab visit 1 week before CPE.   Sarely Stracener K Erich Kochan, MD  06/14/24     Contains text generated by Abridge.        [1]  Allergies Allergen Reactions   Citric Acid Other (See Comments)    Bladder infection   Sulfa Antibiotics Other (See Comments)    Gets sores in mouth   Sulfacetamide Sodium     Other reaction(s): Other (See Comments) Gets sores in mouth  [2]  Current Outpatient Medications on File Prior to Visit  Medication Sig Dispense Refill   levothyroxine  (SYNTHROID ) 75 MCG tablet TAKE ONE TABLET (75 MCG TOTAL) BY MOUTH DAILY. 90 tablet 3   olmesartan (BENICAR) 20 MG tablet TAKE ONE  TABLET (20 MG TOTAL) BY MOUTH DAILY. 90 tablet 3   tiZANidine  (ZANAFLEX ) 4 MG tablet Take 1 tablet (4 mg total) by mouth 2 (two) times daily as needed for muscle spasms. 10 tablet 0   No current facility-administered medications on file prior to visit.   "

## 2024-06-14 NOTE — Assessment & Plan Note (Signed)
 Status post partial thyroidectomy over 10 years ago, which she sustained iatrogenic injury to one of her vocal cords, has not been taking her Synthroid  separately from other meds, counseled to take on an empty stomach an hour before other medications or food, will repeat TSH in 8 weeks time.  - Continue Synthroid  75 mcg daily  Orders:   TSH; Future

## 2024-06-14 NOTE — Patient Instructions (Signed)
 Thank you for visiting [facility] today! Here's what we talked about: - Call Pulmonary if you do not hear from them in 2 weeks  - Get labs 1 week before your physical

## 2024-06-14 NOTE — Assessment & Plan Note (Signed)
 Normotensive this visit at 138/88 within age-appropriate goal.  No changes to regimen, ordering labs for monitoring of kidney, liver function and electrolytes.  - Continue olmesartan 20 mg daily - Counseled about exercise and diet changes given BMI of 32  Orders:   Comprehensive metabolic panel with GFR; Future

## 2024-06-14 NOTE — Assessment & Plan Note (Signed)
 No A1c on file, ordered.  Lipid panel from several months ago shows elevated LDL, ASCVD risk high at 14.5%, not agreeable to initiate statin at this time, counseled about diet/lifestyle changes, plan to repeat lipid panel in a few months just prior to her physical.  Would initiate statin at that time if ASCVD risk greater than 10.  Orders:   Hemoglobin A1c; Future   Lipid panel; Future

## 2024-06-26 ENCOUNTER — Ambulatory Visit: Admitting: Sleep Medicine

## 2024-08-22 ENCOUNTER — Other Ambulatory Visit

## 2024-08-29 ENCOUNTER — Encounter
